# Patient Record
Sex: Female | Born: 1995 | Race: White | Hispanic: No | Marital: Single | Smoking: Never smoker
Health system: Southern US, Community
[De-identification: ages and names within clinical notes are randomized; demographics above are authoritative.]

## PROBLEM LIST (undated history)

## (undated) HISTORY — PX: CHOLECYSTECTOMY: SHX55

---

## 2015-07-21 ENCOUNTER — Emergency Department
Admission: EM | Admit: 2015-07-21 | Discharge: 2015-07-21 | Disposition: A | Discharge status: Home / Self Care | Payer: BLUE CROSS/BLUE SHIELD | Attending: Emergency Medicine | Admitting: Emergency Medicine

## 2015-07-21 ENCOUNTER — Encounter: Payer: Self-pay | Admitting: Emergency Medicine

## 2015-07-21 DIAGNOSIS — M546 Pain in thoracic spine: Secondary | ICD-10-CM | POA: Insufficient documentation

## 2015-07-21 DIAGNOSIS — Z3202 Encounter for pregnancy test, result negative: Secondary | ICD-10-CM | POA: Insufficient documentation

## 2015-07-21 LAB — URINALYSIS COMPLETE WITH MICROSCOPIC (ARMC ONLY)
BILIRUBIN URINE: NEGATIVE
Bacteria, UA: NONE SEEN
GLUCOSE, UA: NEGATIVE mg/dL
Hgb urine dipstick: NEGATIVE
Ketones, ur: NEGATIVE mg/dL
LEUKOCYTES UA: NEGATIVE
Nitrite: NEGATIVE
PH: 6 (ref 5.0–8.0)
PROTEIN: NEGATIVE mg/dL
RBC / HPF: NONE SEEN RBC/hpf (ref 0–5)
SPECIFIC GRAVITY, URINE: 1.016 (ref 1.005–1.030)

## 2015-07-21 LAB — POCT PREGNANCY, URINE: PREG TEST UR: NEGATIVE

## 2015-07-21 MED ORDER — CYCLOBENZAPRINE HCL 5 MG PO TABS: 5.0000 mg | ORAL_TABLET | Freq: Three times a day (TID) | ORAL | Status: DC | PRN

## 2015-07-21 NOTE — Discharge Instructions (Signed)
Please seek medical attention for any high fevers, chest pain, shortness of breath, change in behavior, persistent vomiting, bloody stool or any other new or concerning symptoms. ° ° °Back Pain, Adult °Back pain is very common in adults. The cause of back pain is rarely dangerous and the pain often gets better over time. The cause of your back pain may not be known. Some common causes of back pain include: °· Strain of the muscles or ligaments supporting the spine. °· Wear and tear (degeneration) of the spinal disks. °· Arthritis. °· Direct injury to the back. °For many people, back pain may return. Since back pain is rarely dangerous, most people can learn to manage this condition on their own. °HOME CARE INSTRUCTIONS °Watch your back pain for any changes. The following actions may help to lessen any discomfort you are feeling: °· Remain active. It is stressful on your back to sit or stand in one place for long periods of time. Do not sit, drive, or stand in one place for more than 30 minutes at a time. Take short walks on even surfaces as soon as you are able. Try to increase the length of time you walk each day. °· Exercise regularly as directed by your health care provider. Exercise helps your back heal faster. It also helps avoid future injury by keeping your muscles strong and flexible. °· Do not stay in bed. Resting more than 1-2 days can delay your recovery. °· Pay attention to your body when you bend and lift. The most comfortable positions are those that put less stress on your recovering back. Always use proper lifting techniques, including: °¨ Bending your knees. °¨ Keeping the load close to your body. °¨ Avoiding twisting. °· Find a comfortable position to sleep. Use a firm mattress and lie on your side with your knees slightly bent. If you lie on your back, put a pillow under your knees. °· Avoid feeling anxious or stressed. Stress increases muscle tension and can worsen back pain. It is important to  recognize when you are anxious or stressed and learn ways to manage it, such as with exercise. °· Take medicines only as directed by your health care provider. Over-the-counter medicines to reduce pain and inflammation are often the most helpful. Your health care provider may prescribe muscle relaxant drugs. These medicines help dull your pain so you can more quickly return to your normal activities and healthy exercise. °· Apply ice to the injured area: °¨ Put ice in a plastic bag. °¨ Place a towel between your skin and the bag. °¨ Leave the ice on for 20 minutes, 2-3 times a day for the first 2-3 days. After that, ice and heat may be alternated to reduce pain and spasms. °· Maintain a healthy weight. Excess weight puts extra stress on your back and makes it difficult to maintain good posture. °SEEK MEDICAL CARE IF: °· You have pain that is not relieved with rest or medicine. °· You have increasing pain going down into the legs or buttocks. °· You have pain that does not improve in one week. °· You have night pain. °· You lose weight. °· You have a fever or chills. °SEEK IMMEDIATE MEDICAL CARE IF:  °· You develop new bowel or bladder control problems. °· You have unusual weakness or numbness in your arms or legs. °· You develop nausea or vomiting. °· You develop abdominal pain. °· You feel faint. °  °This information is not intended to replace advice given to you by your health care provider. Make   sure you discuss any questions you have with your health care provider. °  °Document Released: 09/03/2005 Document Revised: 09/24/2014 Document Reviewed: 01/05/2014 °Elsevier Interactive Patient Education ©2016 Elsevier Inc. ° °

## 2015-07-21 NOTE — ED Notes (Addendum)
Pt woke up with severe lower back pain that comes in waves.  Denies hematuria.  Thinks she has kidney stone but no hx kidney stones.  One episode vomiting this morning. Nausea.

## 2015-07-21 NOTE — ED Provider Notes (Signed)
[facility] Emergency Department Provider Note   ____________________________________________  Time seen: 0805  I have reviewed the triage vital signs and the nursing notes.   HISTORY  Chief Complaint Flank Pain   History limited by: Not Limited   HPI Lindsey Torres is a 19 y.o. female who presents to the emergency department today because of concerns for mid back pain. She describes the pain as being located in her mid back bilaterally. It is slightly worse on the left. She describes it as severe and sharp. It comes and goes in waves. It does not radiate to the front or groin. She woke up with this pain. She denies any unusual activity recently. She does work in [employer] and states she was moving around sets but nothing unusual. Denies any hematuria. Denies any fevers.  History reviewed. No pertinent past medical history.  There are no active problems to display for this patient.   History reviewed. No pertinent past surgical history.  No current outpatient prescriptions on file.  Allergies Clindamycin/lincomycin  History reviewed. No pertinent family history.  Social History Social History  Substance Use Topics  . Smoking status: Never Smoker   . Smokeless tobacco: None  . Alcohol Use: Yes    Review of Systems  Constitutional: Negative for fever. Cardiovascular: Negative for chest pain. Respiratory: Negative for shortness of breath. Gastrointestinal: Negative for abdominal pain, vomiting and diarrhea. Genitourinary: Negative for dysuria. Musculoskeletal: Positive for midback pain. Skin: Negative for rash. Neurological: Negative for headaches, focal weakness or numbness.  10-point ROS otherwise negative.  ____________________________________________   PHYSICAL EXAM:  VITAL SIGNS: ED Triage Vitals  Enc Vitals Group     BP 07/21/15 0755 122/74 mmHg     Pulse Rate 07/21/15 0755 74     Resp 07/21/15 0755 18     Temp 07/21/15 0755  98.6 F (37 C)     Temp Source 07/21/15 0755 Oral     SpO2 07/21/15 0755 99 %     Weight 07/21/15 0755 210 lb (95.255 kg)     Height 07/21/15 0755  (1.702 m)     Head Cir --      Peak Flow --      Pain Score 07/21/15 0756 5   Constitutional: Alert and oriented. Well appearing and in no distress. Eyes: Conjunctivae are normal. PERRL. Normal extraocular movements. ENT   Head: Normocephalic and atraumatic.   Nose: No congestion/rhinnorhea.   Mouth/Throat: Mucous membranes are moist.   Neck: No stridor. Hematological/Lymphatic/Immunilogical: No cervical lymphadenopathy. Cardiovascular: Normal rate, regular rhythm.  No murmurs, rubs, or gallops. Respiratory: Normal respiratory effort without tachypnea nor retractions. Breath sounds are clear and equal bilaterally. No wheezes/rales/rhonchi. Gastrointestinal: Soft and nontender. No distention.  Genitourinary: Deferred Musculoskeletal: Normal range of motion in all extremities. No joint effusions.  No lower extremity tenderness nor edema. Tenderness to palpation of the paraspinal thoracic muscles.  Neurologic:  Normal speech and language. No gross focal neurologic deficits are appreciated.  Skin:  Skin is warm, dry and intact. No rash noted. Psychiatric: Mood and affect are normal. Speech and behavior are normal. Patient exhibits appropriate insight and judgment.  ____________________________________________    LABS (pertinent positives/negatives)  Labs Reviewed  URINALYSIS COMPLETEWITH MICROSCOPIC ([facility] ONLY) - Abnormal; Notable for the following:    Color, Urine YELLOW (*)    APPearance CLOUDY (*)    Squamous Epithelial / LPF 0-5 (*)    All other components within normal limits  POCT PREGNANCY, URINE  ____________________________________________   EKG  None  ____________________________________________     RADIOLOGY  None   ____________________________________________   PROCEDURES  Procedure(s) performed: None  Critical Care performed: No  ____________________________________________   INITIAL IMPRESSION / ASSESSMENT AND PLAN / ED COURSE  Pertinent labs & imaging results that were available during my care of the patient were reviewed by me and considered in my medical decision making (see chart for details).  Patient presented to the emergency department today because of concerns for back pain. It's in the mid back and bilateral. Urinalysis did not show any red blood cells. I think kidney stone unlikely given urinalysis and bilateral nature of the pain. No rash seen on exam. Some tenderness to palpation of the paraspinal muscles in the thoracic spine. I think muscle strain most likely. Will discharge home with muscle relaxant.  ____________________________________________   FINAL CLINICAL IMPRESSION(S) / ED DIAGNOSES  Final diagnoses:  Bilateral thoracic back pain     Phineas SemenGraydon Kyshon Tolliver, MD 07/21/15 1229

## 2015-08-28 ENCOUNTER — Encounter: Payer: Self-pay | Admitting: Emergency Medicine

## 2015-08-28 ENCOUNTER — Emergency Department: Payer: BLUE CROSS/BLUE SHIELD

## 2015-08-28 ENCOUNTER — Emergency Department
Admission: EM | Admit: 2015-08-28 | Discharge: 2015-08-28 | Disposition: A | Discharge status: Home / Self Care | Payer: BLUE CROSS/BLUE SHIELD | Attending: Emergency Medicine | Admitting: Emergency Medicine

## 2015-08-28 DIAGNOSIS — Y92009 Unspecified place in unspecified non-institutional (private) residence as the place of occurrence of the external cause: Secondary | ICD-10-CM | POA: Diagnosis not present

## 2015-08-28 DIAGNOSIS — S90851A Superficial foreign body, right foot, initial encounter: Secondary | ICD-10-CM

## 2015-08-28 DIAGNOSIS — S91341A Puncture wound with foreign body, right foot, initial encounter: Secondary | ICD-10-CM | POA: Insufficient documentation

## 2015-08-28 DIAGNOSIS — Y998 Other external cause status: Secondary | ICD-10-CM | POA: Diagnosis not present

## 2015-08-28 DIAGNOSIS — Y9389 Activity, other specified: Secondary | ICD-10-CM | POA: Diagnosis not present

## 2015-08-28 DIAGNOSIS — W25XXXA Contact with sharp glass, initial encounter: Secondary | ICD-10-CM | POA: Insufficient documentation

## 2015-08-28 NOTE — ED Provider Notes (Signed)
[facility] Emergency Department Provider Note ____________________________________________  Time seen: 2210  I have reviewed the triage vital signs and the nursing notes.  HISTORY  Chief Complaint  Foreign Body  HPI Lindsey Torres is a 19 y.o. female reports to the ED with complaints of foreign body sensation to the right plantar surface of the foot. She describes getting a few small pieces of glass stuck in her foot after she broke a water glass at home. She is tender to move the pieces which reduced but has been unable to do so. She denies any other injury at this time. She reports a current tetanus status.  History reviewed. No pertinent past medical history.  There are no active problems to display for this patient.  History reviewed. No pertinent past surgical history.  Current Outpatient Rx  Name  Route  Sig  Dispense  Refill  . cyclobenzaprine (FLEXERIL) 5 MG tablet   Oral   Take 1 tablet (5 mg total) by mouth every 8 (eight) hours as needed for muscle spasms.   20 tablet   0    Allergies Clindamycin/lincomycin  No family history on file.  Social History Social History  Substance Use Topics  . Smoking status: Never Smoker   . Smokeless tobacco: None  . Alcohol Use: Yes   Review of Systems  Constitutional: Negative for fever. Eyes: Negative for visual changes. ENT: Negative for sore throat. Cardiovascular: Negative for chest pain. Respiratory: Negative for shortness of breath. Gastrointestinal: Negative for abdominal pain, vomiting and diarrhea. Genitourinary: Negative for dysuria. Musculoskeletal: Negative for back pain. Skin: Negative for rash. Glass splinters to the foot as above. Neurological: Negative for headaches, focal weakness or numbness. ____________________________________________  PHYSICAL EXAM:  VITAL SIGNS: ED Triage Vitals  Enc Vitals Group     BP 08/28/15 2148 154/70 mmHg     Pulse Rate 08/28/15 2148 94      Resp 08/28/15 2148 20     Temp 08/28/15 2148 97.8 F (36.6 C)     Temp Source 08/28/15 2148 Oral     SpO2 08/28/15 2148 97 %     Weight 08/28/15 2148 215 lb (97.523 kg)     Height 08/28/15 2148  (1.702 m)     Head Cir --      Peak Flow --      Pain Score 08/28/15 2146 4     Pain Loc --      Pain Edu? --      Excl. in GC? --    Constitutional: Alert and oriented. Well appearing and in no distress. Head: Normocephalic and atraumatic.      Eyes: Conjunctivae are normal. PERRL. Normal extraocular movements      Ears: Canals clear. TMs intact bilaterally.   Nose: No congestion/rhinorrhea.   Mouth/Throat: Mucous membranes are moist.   Neck: Supple. No thyromegaly. Hematological/Lymphatic/Immunological: No cervical lymphadenopathy. Cardiovascular: Normal rate, regular rhythm.  Respiratory: Normal respiratory effort. No wheezes/rales/rhonchi. Gastrointestinal: Soft and nontender. No distention. Musculoskeletal: Nontender with normal range of motion in all extremities.  Neurologic:  Normal gait without ataxia. Normal speech and language. No gross focal neurologic deficits are appreciated. Skin:  Skin is warm, dry and intact. No rash noted. Patient with foreign body sensation to the plantar surface of the right foot she has 3 distinct areas with small puncture wounds where she feels glass when she scratches over them.  Psychiatric: Mood and affect are normal. Patient exhibits appropriate insight and judgment. ____________________________________________  PROCEDURES  Small glass splinters are removed using an 18-gauge needle bevel. Patient confirms resolution of foreign body sensation 3. ____________________________________________  INITIAL IMPRESSION / ASSESSMENT AND PLAN / ED COURSE  Right foot with retained glass splinter foreign bodies, status post removal. Patient is discharged with wound care shock shins and encouraged [facility]  symptoms. ____________________________________________  FINAL CLINICAL IMPRESSION(S) / ED DIAGNOSES  Final diagnoses:  Foreign body in foot, right, initial encounter      Lissa HoardJenise V Bacon Tony Granquist, PA-C 08/28/15 2320  Emily FilbertJonathan E Williams, MD 08/28/15 2329

## 2015-08-28 NOTE — ED Notes (Addendum)
Patient ambulatory to triage with steady gait, without difficulty or distress noted; pt reports ?piece of glass to sole of right heel

## 2015-08-28 NOTE — ED Notes (Signed)
Discussed discharge instructions and follow-up care with patient. No questions or concerns at this time. Pt stable at discharge.  

## 2015-08-28 NOTE — ED Notes (Signed)
Pt reports possible 2 pieces of glass that are stuck in her foot (one in heel, one in ball of foot) from a broken water glass. Pt states she tried to remove them with tweezers but was unable to do so and became concerned that it would get infected.

## 2015-08-28 NOTE — ED Notes (Signed)
PA removing glass from heel

## 2015-08-28 NOTE — Discharge Instructions (Signed)
Sliver Removal, Care After A sliver--also called a splinter--is a small and thin broken piece of an object that gets stuck (embedded) under the skin. A sliver can create a deep wound that can easily become infected. It is important to care for the wound after a sliver is removed to help prevent infection and other problems from developing. WHAT TO EXPECT AFTER THE PROCEDURE Slivers often break into smaller pieces when they are removed. If pieces of your sliver broke off and stayed in your skin, you will eventually see them working themselves out and you may feel some pain at the wound site. This is normal. HOME CARE INSTRUCTIONS  Keep all follow-up visits as directed by your health care provider. This is important.  There are many different ways to close and cover a wound, including stitches (sutures) and adhesive strips. Follow your health care provider's instructions about:  Wound care.  Bandage (dressing) changes and removal.  Wound closure removal.  Check the wound site every day for signs of infection. Watch for:  Red streaks coming from the wound.  Fever.  Redness or tenderness around the wound.  Fluid, blood, or pus coming from the wound.  A bad smell coming from the wound. SEEK MEDICAL CARE IF:  You think that a piece of the sliver is still in your skin.  Your wound was closed, as with sutures, and the edges of the wound break open.  You have signs of infection, including:  New or worsening redness around the wound.  New or worsening tenderness around the wound.  Fluid, blood, or pus coming from the wound.  A bad smell coming from the wound or dressing. SEEK IMMEDIATE MEDICAL CARE IF: You have any of the following signs of infection:  Red streaks coming from the wound.  An unexplained fever.   This information is not intended to replace advice given to you by your health care provider. Make sure you discuss any questions you have with your health care  provider.   Document Released: 08/31/2000 Document Revised: 09/24/2014 Document Reviewed: 05/06/2014 Elsevier Interactive Patient Education 2016 Elsevier Inc.  

## 2015-09-20 ENCOUNTER — Emergency Department: Payer: BLUE CROSS/BLUE SHIELD

## 2015-09-20 ENCOUNTER — Inpatient Hospital Stay: Payer: BLUE CROSS/BLUE SHIELD

## 2015-09-20 ENCOUNTER — Encounter: Payer: Self-pay | Admitting: *Deleted

## 2015-09-20 ENCOUNTER — Inpatient Hospital Stay
Admission: EM | Admit: 2015-09-20 | Discharge: 2015-09-21 | DRG: 445 | Disposition: A | Discharge status: Home / Self Care | Payer: BLUE CROSS/BLUE SHIELD | Attending: Specialist | Admitting: Specialist

## 2015-09-20 DIAGNOSIS — A047 Enterocolitis due to Clostridium difficile: Secondary | ICD-10-CM | POA: Diagnosis present

## 2015-09-20 DIAGNOSIS — R1013 Epigastric pain: Secondary | ICD-10-CM | POA: Diagnosis not present

## 2015-09-20 DIAGNOSIS — K8020 Calculus of gallbladder without cholecystitis without obstruction: Secondary | ICD-10-CM

## 2015-09-20 DIAGNOSIS — K831 Obstruction of bile duct: Secondary | ICD-10-CM

## 2015-09-20 DIAGNOSIS — K920 Hematemesis: Secondary | ICD-10-CM | POA: Diagnosis present

## 2015-09-20 DIAGNOSIS — R739 Hyperglycemia, unspecified: Secondary | ICD-10-CM | POA: Diagnosis present

## 2015-09-20 DIAGNOSIS — Z888 Allergy status to other drugs, medicaments and biological substances status: Secondary | ICD-10-CM | POA: Diagnosis not present

## 2015-09-20 DIAGNOSIS — K8021 Calculus of gallbladder without cholecystitis with obstruction: Secondary | ICD-10-CM

## 2015-09-20 DIAGNOSIS — Z79899 Other long term (current) drug therapy: Secondary | ICD-10-CM | POA: Diagnosis not present

## 2015-09-20 DIAGNOSIS — K8051 Calculus of bile duct without cholangitis or cholecystitis with obstruction: Secondary | ICD-10-CM | POA: Diagnosis not present

## 2015-09-20 DIAGNOSIS — R109 Unspecified abdominal pain: Secondary | ICD-10-CM

## 2015-09-20 LAB — URINALYSIS COMPLETE WITH MICROSCOPIC (ARMC ONLY)
GLUCOSE, UA: NEGATIVE mg/dL
HGB URINE DIPSTICK: NEGATIVE
Ketones, ur: NEGATIVE mg/dL
Leukocytes, UA: NEGATIVE
Nitrite: NEGATIVE
Protein, ur: NEGATIVE mg/dL
Specific Gravity, Urine: 1.021 (ref 1.005–1.030)
WBC, UA: NONE SEEN WBC/hpf (ref 0–5)
pH: 7 (ref 5.0–8.0)

## 2015-09-20 LAB — CREATININE, SERUM
CREATININE: 0.69 mg/dL (ref 0.44–1.00)
GFR calc non Af Amer: >60 mL/min (ref >60)

## 2015-09-20 LAB — COMPREHENSIVE METABOLIC PANEL
ALK PHOS: 112 U/L (ref 38–126)
ALT: 84 U/L — AB (ref 14–54)
AST: 126 U/L — AB (ref 15–41)
Albumin: 4.4 g/dL (ref 3.5–5.0)
Anion gap: 7 (ref 5–15)
BUN: 10 mg/dL (ref 6–20)
CALCIUM: 9.8 mg/dL (ref 8.9–10.3)
CO2: 28 mmol/L (ref 22–32)
CREATININE: 0.7 mg/dL (ref 0.44–1.00)
Chloride: 101 mmol/L (ref 101–111)
Glucose, Bld: 102 mg/dL — ABNORMAL HIGH (ref 65–99)
Potassium: 4 mmol/L (ref 3.5–5.1)
SODIUM: 136 mmol/L (ref 135–145)
Total Bilirubin: 3.4 mg/dL — ABNORMAL HIGH (ref 0.3–1.2)
Total Protein: 8.5 g/dL — ABNORMAL HIGH (ref 6.5–8.1)

## 2015-09-20 LAB — CBC
HCT: 42.7 % (ref 35.0–47.0)
HCT: 41.8 % (ref 35.0–47.0)
Hemoglobin: 14.4 g/dL (ref 12.0–16.0)
Hemoglobin: 14.3 g/dL (ref 12.0–16.0)
MCH: 28.4 pg (ref 26.0–34.0)
MCH: 28.4 pg (ref 26.0–34.0)
MCHC: 33.8 g/dL (ref 32.0–36.0)
MCHC: 34.3 g/dL (ref 32.0–36.0)
MCV: 84.1 fL (ref 80.0–100.0)
MCV: 82.7 fL (ref 80.0–100.0)
PLATELETS: 286 10*3/uL (ref 150–440)
PLATELETS: 259 10*3/uL (ref 150–440)
RBC: 5.07 MIL/uL (ref 3.80–5.20)
RBC: 5.05 MIL/uL (ref 3.80–5.20)
RDW: 13.5 % (ref 11.5–14.5)
RDW: 13.3 % (ref 11.5–14.5)
WBC: 7 10*3/uL (ref 3.6–11.0)
WBC: 7.8 10*3/uL (ref 3.6–11.0)

## 2015-09-20 LAB — BILIRUBIN, DIRECT: BILIRUBIN DIRECT: 0.8 mg/dL — AB (ref 0.1–0.5)

## 2015-09-20 LAB — LIPASE, BLOOD: Lipase: 18 U/L (ref 11–51)

## 2015-09-20 LAB — POCT PREGNANCY, URINE: PREG TEST UR: NEGATIVE

## 2015-09-20 MED ORDER — DOCUSATE SODIUM 100 MG PO CAPS
100.0000 mg | ORAL_CAPSULE | Freq: Two times a day (BID) | ORAL | Status: DC
  Administered 2015-09-20 – 2015-09-21 (×2): 100 mg via ORAL
  Filled 2015-09-20 (×2): qty 1

## 2015-09-20 MED ORDER — NORETHIN ACE-ETH ESTRAD-FE 1-20 MG-MCG PO TABS
1.0000 | ORAL_TABLET | Freq: Every day | ORAL | Status: DC
  Administered 2015-09-21: 1 via ORAL

## 2015-09-20 MED ORDER — LEVOFLOXACIN IN D5W 500 MG/100ML IV SOLN
500.0000 mg | INTRAVENOUS | Status: DC
  Administered 2015-09-20: 500 mg via INTRAVENOUS
  Filled 2015-09-20: qty 100

## 2015-09-20 MED ORDER — ONDANSETRON 4 MG PO TBDP
ORAL_TABLET | ORAL | Status: AC
  Filled 2015-09-20: qty 1

## 2015-09-20 MED ORDER — PANTOPRAZOLE SODIUM 40 MG IV SOLR
40.0000 mg | Freq: Two times a day (BID) | INTRAVENOUS | Status: DC
  Administered 2015-09-20 – 2015-09-21 (×2): 40 mg via INTRAVENOUS
  Filled 2015-09-20 (×2): qty 40

## 2015-09-20 MED ORDER — HYDROCODONE-ACETAMINOPHEN 5-325 MG PO TABS: 1.0000 | ORAL_TABLET | ORAL | Status: DC | PRN

## 2015-09-20 MED ORDER — ACETAMINOPHEN 325 MG PO TABS: 650.0000 mg | ORAL_TABLET | Freq: Four times a day (QID) | ORAL | Status: DC | PRN

## 2015-09-20 MED ORDER — MORPHINE SULFATE (PF) 4 MG/ML IV SOLN: 4.0000 mg | Freq: Once | INTRAVENOUS | Status: DC

## 2015-09-20 MED ORDER — PANTOPRAZOLE SODIUM 40 MG PO TBEC
40.0000 mg | DELAYED_RELEASE_TABLET | Freq: Once | ORAL | Status: AC
  Administered 2015-09-20: 40 mg via ORAL

## 2015-09-20 MED ORDER — ONDANSETRON 4 MG PO TBDP
4.0000 mg | ORAL_TABLET | Freq: Once | ORAL | Status: AC
  Administered 2015-09-20: 4 mg via ORAL

## 2015-09-20 MED ORDER — ONDANSETRON HCL 4 MG/2ML IJ SOLN: 4.0000 mg | Freq: Once | INTRAMUSCULAR | Status: DC

## 2015-09-20 MED ORDER — GADOBENATE DIMEGLUMINE 529 MG/ML IV SOLN
20.0000 mL | Freq: Once | INTRAVENOUS | Status: AC | PRN
  Administered 2015-09-20: 19 mL via INTRAVENOUS

## 2015-09-20 MED ORDER — SODIUM CHLORIDE 0.9 % IV SOLN
INTRAVENOUS | Status: DC
  Administered 2015-09-20 – 2015-09-21 (×2): via INTRAVENOUS

## 2015-09-20 MED ORDER — PANTOPRAZOLE SODIUM 40 MG IV SOLR: 40.0000 mg | Freq: Once | INTRAVENOUS | Status: DC

## 2015-09-20 MED ORDER — MORPHINE SULFATE (PF) 4 MG/ML IV SOLN: 4.0000 mg | INTRAVENOUS | Status: DC | PRN

## 2015-09-20 MED ORDER — SODIUM CHLORIDE 0.9 % IV BOLUS (SEPSIS)
1000.0000 mL | Freq: Once | INTRAVENOUS | Status: DC
Start: 2015-09-20 — End: 2015-09-20

## 2015-09-20 MED ORDER — ONDANSETRON HCL 4 MG PO TABS: 4.0000 mg | ORAL_TABLET | Freq: Four times a day (QID) | ORAL | Status: DC | PRN

## 2015-09-20 MED ORDER — ONDANSETRON HCL 4 MG/2ML IJ SOLN: 4.0000 mg | Freq: Four times a day (QID) | INTRAMUSCULAR | Status: DC | PRN

## 2015-09-20 MED ORDER — PANTOPRAZOLE SODIUM 40 MG PO TBEC
DELAYED_RELEASE_TABLET | ORAL | Status: AC
  Filled 2015-09-20: qty 1

## 2015-09-20 MED ORDER — ENOXAPARIN SODIUM 40 MG/0.4ML ~~LOC~~ SOLN: 40.0000 mg | SUBCUTANEOUS | Status: DC

## 2015-09-20 MED ORDER — LORAZEPAM 2 MG/ML IJ SOLN: 1.0000 mg | INTRAMUSCULAR | Status: DC | PRN

## 2015-09-20 MED ORDER — ACETAMINOPHEN 650 MG RE SUPP: 650.0000 mg | Freq: Four times a day (QID) | RECTAL | Status: DC | PRN

## 2015-09-20 NOTE — ED Notes (Signed)
Report called to floor nurse.  Iv meds infusing.  Friends with pt.

## 2015-09-20 NOTE — Progress Notes (Signed)
Dr. Clint GuyHower notified of pt's MR[place] [facility]. Asked MD for pt to have a diet.  MD will place order.

## 2015-09-20 NOTE — H&P (Signed)
[facility] [place] [facility]   PATIENT NAME: Lindsey FinesKyra Torres    MR#:  [id number]  DATE OF BIRTH:  06/19/1996  DATE OF ADMISSION:  09/20/2015  PRIMARY CARE PHYSICIAN: Pcp Not In System   REQUESTING/REFERRING PHYSICIAN:   CHIEF COMPLAINT:   Chief Complaint  Patient presents with  . Abdominal Pain    HISTORY OF PRESENT ILLNESS: Lindsey Torres  is a 20 y.o. female with a known history of patient is a 20 year old Caucasian female with past medical history significant for history of C. difficile colitis after using clindamycin who presents to the hospital with complaints of intermittent epigastric abdominal pains since November 2016, and the nausea and vomiting. According to the patient, she had 3 separate pain attacks in upper abdomen with nausea, vomiting. Pain was described as 9 out of 10 by intensity, intermittent pain and is now alleviating or aggravating factors, radiating to the back, occasionally she would vomit and at some point pain would subside.  Yesterday she had an episode of pain again and with vomiting is she noted streaks of blood in her vomitus. On arrival to emergency room, she was noted to have elevated total bilirubin and went ultrasound of her right upper quadrant which revealed gallstones but normal common bile duct size. Patient is afebrile  PAST MEDICAL HISTORY:  History reviewed. No pertinent past medical history.  PAST SURGICAL HISTORY: History reviewed. No pertinent past surgical history.  SOCIAL HISTORY:  Social History  Substance Use Topics  . Smoking status: Never Smoker   . Smokeless tobacco: Not on file  . Alcohol Use: Yes    FAMILY HISTORY: No family history on file.  DRUG ALLERGIES:  Allergies  Allergen Reactions  . Clindamycin/Lincomycin Other (See Comments)    cdif    Review of Systems  Constitutional: Negative for fever, chills, weight loss and malaise/fatigue.  HENT: Negative for congestion.   Eyes: Negative for  blurred vision and double vision.  Respiratory: Negative for cough, sputum production, shortness of breath and wheezing.   Cardiovascular: Negative for chest pain, palpitations, orthopnea, leg swelling and PND.  Gastrointestinal: Positive for abdominal pain. Negative for nausea, vomiting, diarrhea, constipation, blood in stool and melena.  Genitourinary: Negative for dysuria, urgency, frequency and hematuria.  Musculoskeletal: Negative for falls.  Skin: Negative for rash.  Neurological: Negative for dizziness and weakness.  Psychiatric/Behavioral: Negative for depression and memory loss. The patient is not nervous/anxious.     MEDICATIONS AT HOME:  Prior to Admission medications   Medication Sig Start Date End Date Taking? Authorizing Provider  norethindrone-ethinyl estradiol (JUNEL FE,GILDESS FE,LOESTRIN FE) 1-20 MG-MCG tablet Take 1 tablet by mouth daily.   Yes Historical Provider, MD      PHYSICAL EXAMINATION:   VITAL SIGNS: Blood pressure 111/75, pulse 76, temperature 98.1 F (36.7 C), temperature source Oral, resp. rate 20, height 5\' 7"  (1.702 m), weight 92.987 kg (205 lb), last menstrual period 09/15/2015, SpO2 96 %.  GENERAL:  20 y.o.-year-old patient lying in the bed with no acute distress.  EYES: Pupils equal, round, reactive to light and accommodation. No scleral icterus. Extraocular muscles intact.  HEENT: Head atraumatic, normocephalic. Oropharynx and nasopharynx clear.  NECK:  Supple, no jugular venous distention. No thyroid enlargement, no tenderness.  LUNGS: Normal breath sounds bilaterally, no wheezing, rales,rhonchi or crepitation. No use of accessory muscles of respiration.  CARDIOVASCULAR: S1, S2 normal. No murmurs, rubs, or gallops.  ABDOMEN: Soft, significant epigastric and right upper quadrant pain was noted on  palpation but no rebound or guarding, nondistended. Bowel sounds present. No organomegaly or mass.  EXTREMITIES: No pedal edema, cyanosis, or clubbing.   NEUROLOGIC: Cranial nerves II through XII are intact. Muscle strength 5/5 in all extremities. Sensation intact. Gait not checked.  PSYCHIATRIC: The patient is alert and oriented x 3.  SKIN: No obvious rash, lesion, or ulcer.   LABORATORY PANEL:   CBC  Recent Labs Lab 09/20/15 1215  WBC 7.0  HGB 14.4  HCT 42.7  PLT 286  MCV 84.1  MCH 28.4  MCHC 33.8  RDW 13.5   ------------------------------------------------------------------------------------------------------------------  Chemistries   Recent Labs Lab 09/20/15 1215  NA 136  K 4.0  CL 101  CO2 28  GLUCOSE 102*  BUN 10  CREATININE 0.70  CALCIUM 9.8  AST 126*  ALT 84*  ALKPHOS 112  BILITOT 3.4*   ------------------------------------------------------------------------------------------------------------------  Cardiac Enzymes No results for input(s): TROPONINI in the last 168 hours. ------------------------------------------------------------------------------------------------------------------  RADIOLOGY: Dg Chest 1 View  09/20/2015  CLINICAL DATA:  Epigastric pain today an beginning yesterday. Worsening shortness of breath, vomiting. EXAM: CHEST 1 VIEW COMPARISON:  None. FINDINGS: The heart size and mediastinal contours are within normal limits. Both lungs are clear. The visualized skeletal structures are unremarkable. IMPRESSION: No active disease. Electronically Signed   By: Charlett Nose M.D.   On: 09/20/2015 15:36   US Abdomen Limited Ruq  09/20/2015  CLINICAL DATA:  Epigastric pain for 1 day EXAM: US ABDOMEN LIMITED - RIGHT UPPER QUADRANT COMPARISON:  None. FINDINGS: Gallbladder: Multiple mobile gallstones are noted within gallbladder the largest measures 7 mm. No thickening of gallbladder wall. No sonographic Murphy's sign. Common bile duct: Diameter: 3 mm in diameter within normal limits. Liver: No focal lesion identified. Within normal limits in parenchymal echogenicity. IMPRESSION: 1. Multiple mobile  gallstones are noted within gallbladder the largest measures 7 mm. No thickening of gallbladder wall. No sonographic Murphy's sign. Normal CBD. Electronically Signed   By: Natasha Mead M.D.   On: 09/20/2015 16:07    EKG: No orders found for this or any previous visit.  IMPRESSION AND PLAN:  Principal Problem:   Obstructive jaundice Active Problems:   Epigastric abdominal pain   Gallstones   Hyperglycemia 1. Obstructive jaundice, concerning for common bile duct stone, admitted patient medical floor, get MRCP and ERCP if MRCP is positive for common bile duct stone 2. Cholelithiasis, patient will need to have gallbladder surgery done at some point. However, patient's family wants her to have that procedure. In [place], where she is from. Discussed with patient's family 3. Epigastric abdominal pain due to gallbladder disease/gallstones, pain medications as needed 4. Hyperglycemia, get hemoglobin A1c to rule out diabetes 5. Hematemesis. Initiate patient on Protonix intravenously, likely trauma related follow hemoglobin level closely  All the records are reviewed and case discussed with ED provider. Management plans discussed with the patient, family and they are in agreement.  CODE STATUS:    TOTAL TIME TAKING CARE OF THIS PATIENT: 65 minutes.   His chest is radiology attempted to reach MR Department, discussed with patient's family, time spent approximately 15 minutes on additional discussions  Ilona Colley M.D on 09/20/2015 at 5:24 PM  Between 7am to 6pm - Pager - [phone] After 6pm go to [email or web address] - password [email or web address]  [facility]  Office  [phone]  CC: Primary care physician; Pcp Not In System

## 2015-09-20 NOTE — ED Notes (Signed)
Patient transported to Ultrasound 

## 2015-09-20 NOTE — ED Provider Notes (Signed)
[facility] Emergency Department Provider Note  ____________________________________________  Time seen: Approximately 250 PM  I have reviewed the triage vital signs and the nursing notes.   HISTORY  Chief Complaint Abdominal Pain    HPI Eual Lindsey Torres is a 20 y.o. female without any chronic medical issues who is presenting to the emergency Department today with epigastric abdominal pain with vomiting. She says that she has had 3 episodes of this starting over the past several weeks. She says that her abdominal pain first started during a trip the [place] where she was evaluated with a CAT scan of the abdomen at in [facility] where she was found have "sludge" in her liver.She says that the abdominal pain, nausea and vomiting has returned over the past day with pain to the top of her stomach which she describes a cramping. She says that she has vomited 3 times and says that she has noticed blood streaks in her vomit. She says that she does not have any diarrhea. Her pain is a 6 out of 10 right now it is radiating through to her right side of her back. Says that she'll drink occasionally not heavily and does not use any drugs regularly. Does not want an IV or pain meds at this time because she does not have a ride home. Patient says that she has appointment tomorrow with her gastroenterologist in [place].   History reviewed. No pertinent past medical history.  There are no active problems to display for this patient.   History reviewed. No pertinent past surgical history.  Current Outpatient Rx  Name  Route  Sig  Dispense  Refill  . cyclobenzaprine (FLEXERIL) 5 MG tablet   Oral   Take 1 tablet (5 mg total) by mouth every 8 (eight) hours as needed for muscle spasms.   20 tablet   0     Allergies Clindamycin/lincomycin  No family history on file.  Social History Social History  Substance Use Topics  . Smoking status: Never Smoker   . Smokeless  tobacco: None  . Alcohol Use: Yes    Review of Systems Constitutional: No fever/chills Eyes: No visual changes. ENT: No sore throat. Cardiovascular: Denies chest pain. Respiratory: Denies shortness of breath. Gastrointestinal: No diarrhea.  No constipation. Genitourinary: Negative for dysuria. Musculoskeletal: Negative for back pain. Skin: Negative for rash. Neurological: Negative for headaches, focal weakness or numbness.  10-point ROS otherwise negative.  ____________________________________________   PHYSICAL EXAM:  VITAL SIGNS: ED Triage Vitals  Enc Vitals Group     BP 09/20/15 1205 111/75 mmHg     Pulse Rate 09/20/15 1205 76     Resp 09/20/15 1205 20     Temp 09/20/15 1205 98.1 F (36.7 C)     Temp Source 09/20/15 1205 Oral     SpO2 09/20/15 1205 96 %     Weight 09/20/15 1205 205 lb (92.987 kg)     Height 09/20/15 1205 5\' 7"  (1.702 m)     Head Cir --      Peak Flow --      Pain Score 09/20/15 1212 6     Pain Loc --      Pain Edu? --      Excl. in GC? --     Constitutional: Alert and oriented. Well appearing and in no acute distress. Eyes: Conjunctivae are normal. PERRL. EOMI. Head: Atraumatic. Nose: No congestion/rhinnorhea. Mouth/Throat: Mucous membranes are moist.   Neck: No stridor.   Cardiovascular: Normal rate, regular  rhythm. Grossly normal heart sounds.  Good peripheral circulation. Respiratory: Normal respiratory effort.  No retractions. Lungs CTAB. Gastrointestinal: Soft with tenderness palpation of the epigastrium and right upper quadrant with a positive Murphy sign. No distention. No abdominal bruits. No CVA tenderness. Musculoskeletal: No lower extremity tenderness nor edema.  No joint effusions. Neurologic:  Normal speech and language. No gross focal neurologic deficits are appreciated. No gait instability. Skin:  Skin is warm, dry and intact. No rash noted. Psychiatric: Mood and affect are normal. Speech and behavior are  normal.  ____________________________________________   LABS (all labs ordered are listed, but only abnormal results are displayed)  Labs Reviewed  COMPREHENSIVE METABOLIC PANEL - Abnormal; Notable for the following:    Glucose, Bld 102 (*)    Total Protein 8.5 (*)    AST 126 (*)    ALT 84 (*)    Total Bilirubin 3.4 (*)    All other components within normal limits  URINALYSIS COMPLETEWITH MICROSCOPIC ([facility] ONLY) - Abnormal; Notable for the following:    Color, Urine AMBER (*)    APPearance CLOUDY (*)    Bilirubin Urine 1+ (*)    Bacteria, UA RARE (*)    Squamous Epithelial / LPF 0-5 (*)    All other components within normal limits  LIPASE, BLOOD  CBC  POC URINE PREG, ED  POCT PREGNANCY, URINE   ____________________________________________  EKG  ____________________________________________  RADIOLOGY  Chest x-ray without any active disease. Ultrasound of the gallbladder with multiple mobile gallstones with the largest measuring 7 mm. No thickening of the gallbladder wall. No sonographic Murphy sign. Normal CBD. ____________________________________________   PROCEDURES   ____________________________________________   INITIAL IMPRESSION / ASSESSMENT AND PLAN / ED COURSE  Pertinent labs & imaging results that were available during my care of the patient were reviewed by me and considered in my medical decision making (see chart for details).  ----------------------------------------- 5:02 PM on 09/20/2015 -----------------------------------------  Patient resting comfortably but still with tenderness to the upper abdomen on exam. Discussed case Dr.Woodham of the surgical service who is aware and says to admit to medicine for likely choledocholithiasis. Dr. Tonita Cong so the patient will likely need an ERCP. I explained the diagnosis as well as the need for admission to the patient as well as her parents. She requested that I speak to her parents.  Parents said that  they were considering bring her back to [place] where she is from where she may be worked up by a known gastroenterologist. They asked what would be the potential drawbacks of this and explained that the biggest issue would likely be a delay in care. I explained that the patient is showing some early signs of liver damage with elevated transaminases. Her bilirubin is also elevated at 3.4.  Signed out to Dr. Seth Bake of the medicine service who says that she will likely perform MRCP initially. Patient understanding of the plan and willing to comply. ____________________________________________   FINAL CLINICAL IMPRESSION(S) / ED DIAGNOSES  Final diagnoses:  Epigastric abdominal pain   Cholelithiasis. Choledocholithiasis.   Myrna Blazer, MD 09/20/15 [phone]

## 2015-09-20 NOTE — ED Notes (Signed)
Pt reports abdominal pain since Thanksgiving, pt was seen at an ER while on vacation in [place], per pt gallbladder was ruled out, pt reports nausea and vomiting

## 2015-09-21 LAB — COMPREHENSIVE METABOLIC PANEL
ALBUMIN: 3.5 g/dL (ref 3.5–5.0)
ALK PHOS: 88 U/L (ref 38–126)
ALT: 82 U/L — ABNORMAL HIGH (ref 14–54)
ANION GAP: 6 (ref 5–15)
AST: 79 U/L — AB (ref 15–41)
BILIRUBIN TOTAL: 1.2 mg/dL (ref 0.3–1.2)
BUN: 9 mg/dL (ref 6–20)
CO2: 25 mmol/L (ref 22–32)
Calcium: 9.1 mg/dL (ref 8.9–10.3)
Chloride: 105 mmol/L (ref 101–111)
Creatinine, Ser: 0.71 mg/dL (ref 0.44–1.00)
GFR calc Af Amer: >60 mL/min (ref >60)
GFR calc non Af Amer: >60 mL/min (ref >60)
GLUCOSE: 106 mg/dL — AB (ref 65–99)
POTASSIUM: 3.7 mmol/L (ref 3.5–5.1)
SODIUM: 136 mmol/L (ref 135–145)
TOTAL PROTEIN: 6.9 g/dL (ref 6.5–8.1)

## 2015-09-21 LAB — CBC
HEMATOCRIT: 37.5 % (ref 35.0–47.0)
HEMOGLOBIN: 12.8 g/dL (ref 12.0–16.0)
MCH: 27.6 pg (ref 26.0–34.0)
MCHC: 34.1 g/dL (ref 32.0–36.0)
MCV: 80.9 fL (ref 80.0–100.0)
Platelets: 234 10*3/uL (ref 150–440)
RBC: 4.63 MIL/uL (ref 3.80–5.20)
RDW: 13.1 % (ref 11.5–14.5)
WBC: 7.9 10*3/uL (ref 3.6–11.0)

## 2015-09-21 LAB — HEMOGLOBIN A1C: Hgb A1c MFr Bld: 5.3 % (ref 4.0–6.0)

## 2015-09-21 LAB — TYPE AND SCREEN
ABO/RH(D): O POS
Antibody Screen: NEGATIVE

## 2015-09-21 LAB — HEMOGLOBIN: Hemoglobin: 12.6 g/dL (ref 12.0–16.0)

## 2015-09-21 LAB — ABO/RH: ABO/RH(D): O POS

## 2015-09-21 NOTE — Discharge Summary (Signed)
[facility] [place] [facility]   PATIENT NAME: Lindsey Torres    MR#:  [id number]  DATE OF BIRTH:  10/28/1995  DATE OF ADMISSION:  09/20/2015 ADMITTING PHYSICIAN: Katharina Caperima Vaickute, MD  DATE OF DISCHARGE: 09/21/2015  2:25 PM  PRIMARY CARE PHYSICIAN: Pcp Not In System    ADMISSION DIAGNOSIS:  Epigastric abdominal pain [R10.13] Abdominal pain [R10.9] Calculus of gallbladder with biliary obstruction but without cholecystitis [K80.21]  DISCHARGE DIAGNOSIS:  Principal Problem:   Obstructive jaundice Active Problems:   Epigastric abdominal pain   Gallstones   Hyperglycemia   SECONDARY DIAGNOSIS:  History reviewed. No pertinent past medical history.  HOSPITAL COURSE:   20 year old female with no significant past medical history who presented to the hospital abdominal pain nausea, vomiting and noted to have abnormal LFTs and CT scan findings suggestive of gallstones and choledocholithiasis  #1 abdominal pain-this was secondary to biliary colic. Patient was noted to have a CT scan suggestive of gallstones and a possible common bile duct stone. She also had abnormal LFTs with elevated total bilirubin. She was admitted to the hospital started on supportive care with IV fluids, antiemetics, pain control. An MRCP was obtained.  The results of the MRCP were negative without any evidence of common bile duct stones. -With supportive care patient's LFTs improved and her bilirubins came down. She still had some mild right upper quadrant pain but no fever, nausea, vomiting. She was able to tolerate a diet well. -She likely needs a laparoscopic cholecystectomy in the near future and this was offered to the patient and her mother but they will prefer to have the [facility] and have the surgery done there. She was discharged home as she was clinically feeling well and the mother has an appointment with a general [facility] the next day at 10:30 AM. -The mother was  also advised that the patient should follow a low-fat diet and she is at risk for having another biliary colic attack at any time and she was aware of that.    She is being discharged home on a low fat diet with [facility].   DISCHARGE CONDITIONS:   stable  CONSULTS OBTAINED:     DRUG ALLERGIES:   Allergies  Allergen Reactions  . Clindamycin/Lincomycin Other (See Comments)    cdif    DISCHARGE MEDICATIONS:   Discharge Medication List as of 09/21/2015  2:02 PM    CONTINUE these medications which have NOT CHANGED   Details  norethindrone-ethinyl estradiol (JUNEL FE,GILDESS FE,LOESTRIN FE) 1-20 MG-MCG tablet Take 1 tablet by mouth daily., Until Discontinued, Historical Med         DISCHARGE INSTRUCTIONS:   DIET:  Low fat, Low cholesterol diet  DISCHARGE CONDITION:  Stable  ACTIVITY:  Activity as tolerated  OXYGEN:  Home Oxygen: No.   Oxygen Delivery: room air  DISCHARGE LOCATION:  home   If you experience worsening of your admission symptoms, develop shortness of breath, life threatening emergency, suicidal or homicidal thoughts you must seek medical attention immediately by calling 911 or calling your MD immediately  if symptoms less severe.  You Must read complete instructions/literature along with all the possible adverse reactions/side effects for all the Medicines you take and that have been prescribed to you. Take any new Medicines after you have completely understood and accpet all the possible adverse reactions/side effects.   Please note  You were cared for by a hospitalist during your hospital stay.  If you have any questions about your discharge medications or the care you received while you were in the hospital after you are discharged, you can call the unit and asked to speak with the hospitalist on call if the hospitalist that took care of you is not available. Once you are discharged, your primary care physician will handle any further  medical issues. Please note that NO REFILLS for any discharge medications will be authorized once you are discharged, as it is imperative that you return to your primary care physician (or establish a relationship with a primary care physician if you do not have one) for your aftercare needs so that they can reassess your need for medications and monitor your lab values.     Today   Mild abdominal pain, no N/V, fever, chills. Tolerating PO well.   VITAL SIGNS:  Blood pressure 104/51, pulse 71, temperature 98.1 F (36.7 C), temperature source Oral, resp. rate 16, height 5\' 7"  (1.702 m), weight 92.987 kg (205 lb), last menstrual period 09/15/2015, SpO2 99 %.  I/O:   Intake/Output Summary (Last 24 hours) at 09/21/15 1723 Last data filed at 09/21/15 0900  Gross per 24 hour  Intake    715 ml  Output    250 ml  Net    465 ml    PHYSICAL EXAMINATION:  GENERAL:  20 y.o.-year-old patient lying in the bed with no acute distress.  EYES: Pupils equal, round, reactive to light and accommodation. No scleral icterus. Extraocular muscles intact.  HEENT: Head atraumatic, normocephalic. Oropharynx and nasopharynx clear.  NECK:  Supple, no jugular venous distention. No thyroid enlargement, no tenderness.  LUNGS: Normal breath sounds bilaterally, no wheezing, rales,rhonchi. No use of accessory muscles of respiration.  CARDIOVASCULAR: S1, S2 normal. No murmurs, rubs, or gallops.  ABDOMEN: Soft, mild ruq abdominal pain but no rebound, rigidity, non-distended. Bowel sounds present. No organomegaly or mass.  EXTREMITIES: No pedal edema, cyanosis, or clubbing.  NEUROLOGIC: Cranial nerves II through XII are intact. No focal motor or sensory defecits b/l.  PSYCHIATRIC: The patient is alert and oriented x 3. Good affect.  SKIN: No obvious rash, lesion, or ulcer.   DATA REVIEW:   CBC  Recent Labs Lab 09/21/15 0218 09/21/15 0929  WBC 7.9  --   HGB 12.8 12.6  HCT 37.5  --   PLT 234  --      Chemistries   Recent Labs Lab 09/21/15 0218  NA 136  K 3.7  CL 105  CO2 25  GLUCOSE 106*  BUN 9  CREATININE 0.71  CALCIUM 9.1  AST 79*  ALT 82*  ALKPHOS 88  BILITOT 1.2    Cardiac Enzymes No results for input(s): TROPONINI in the last 168 hours.  Microbiology Results  No results found for this or any previous visit.  RADIOLOGY:  Dg Chest 1 View  09/20/2015  CLINICAL DATA:  Epigastric pain today an beginning yesterday. Worsening shortness of breath, vomiting. EXAM: CHEST 1 VIEW COMPARISON:  None. FINDINGS: The heart size and mediastinal contours are within normal limits. Both lungs are clear. The visualized skeletal structures are unremarkable. IMPRESSION: No active disease. Electronically Signed   By: Charlett Nose M.D.   On: 09/20/2015 15:36   Mr Abd W/wo Cm/mrcp  09/20/2015  CLINICAL DATA:  Elevated bilirubin. Multiple gallstones. Concern for common bile duct stone. EXAM: MRI ABDOMEN WITHOUT AND WITH CONTRAST (INCLUDING MRCP) TECHNIQUE: Multiplanar multisequence MR imaging of the abdomen was performed both before and after the administration  of intravenous contrast. Heavily T2-weighted images of the biliary and pancreatic ducts were obtained, and three-dimensional MRCP images were rendered by post processing. CONTRAST:  19mL MULTIHANCE GADOBENATE DIMEGLUMINE 529 MG/ML IV SOLN COMPARISON:  Ultrasound 09/20/2015 FINDINGS: Lower chest:  Lung bases are clear. Hepatobiliary: Liver has normal signal intensity. No hepatic steatosis. No intrahepatic biliary duct dilatation. The common hepatic duct and common bile duct are normal caliber. The common hepatic duct measures 4 mm while the common bile duct measures 3 mm. There is no filling defect within the common bile duct. No external compression. Multiple small gallstones layer dependently within the gallbladder lumen measuring approximately 3 mm each. There is no gallbladder wall thickening or inflammation evident. The post-contrast  imaging demonstrates no enhancing hepatic lesion. Pancreas: Normal pancreatic parenchymal intensity. No ductal dilatation or inflammation. Spleen: Normal spleen. Adrenals/urinary tract: Adrenal glands and kidneys are normal. Stomach/Bowel: Submental and limited view of the bowel is unremarkable. Vascular/Lymphatic: No abdominal lymphadenopathy. Musculoskeletal: No aggressive osseous lesion. IMPRESSION: 1. No evidence of biliary obstruction.  No choledocholithiasis. 2. Multiple gallstones within the lumen of the gallbladder without evidence cholecystitis. 3. No focal hepatic abnormality. These results will be called to the ordering clinician or representative by the Radiologist Assistant, and communication documented in the PACS or zVision Dashboard. Electronically Signed   By: Genevive Bi M.D.   On: 09/20/2015 21:01   US Abdomen Limited Ruq  09/20/2015  CLINICAL DATA:  Epigastric pain for 1 day EXAM: US ABDOMEN LIMITED - RIGHT UPPER QUADRANT COMPARISON:  None. FINDINGS: Gallbladder: Multiple mobile gallstones are noted within gallbladder the largest measures 7 mm. No thickening of gallbladder wall. No sonographic Murphy's sign. Common bile duct: Diameter: 3 mm in diameter within normal limits. Liver: No focal lesion identified. Within normal limits in parenchymal echogenicity. IMPRESSION: 1. Multiple mobile gallstones are noted within gallbladder the largest measures 7 mm. No thickening of gallbladder wall. No sonographic Murphy's sign. Normal CBD. Electronically Signed   By: Natasha Mead M.D.   On: 09/20/2015 16:07      Management plans discussed with the patient, family and they are in agreement.  CODE STATUS:     Code Status Orders        Start     Ordered   09/20/15 1840  Full code   Continuous     09/20/15 1839      TOTAL TIME TAKING CARE OF THIS PATIENT: 40 minutes.    Houston Siren M.D on 09/21/2015 at 5:23 PM  Between 7am to 6pm - Pager - [phone]  After 6pm go to  [email or web address] - password [email or web address]  [facility]  Office  [phone]  CC: Primary care physician; Pcp Not In System

## 2015-09-21 NOTE — Progress Notes (Signed)
Attempted to call on call MD in reference to pt's Hg level drop from 14.3 to 12.8. MD currently in code blue at this time. This nurse will call later.

## 2015-09-21 NOTE — Progress Notes (Signed)
Dr. Sheryle Hailiamond notified of pt's drop in hg level from 14.3 to 12.8. Orders to place type and screen.

## 2015-09-21 NOTE — Progress Notes (Signed)
Pt discharged per MD orders. VSS. IV removed per policy. Discharge instructions removed w/ pt and mother. Both verbalized understanding. No prescriptions. Discharged ambulatory, escorted by mother.

## 2015-09-21 NOTE — Progress Notes (Signed)
[facility]    Lindsey KennedyKyra Rush BarerGerber was admitted to the Hospital on 09/20/2015 and Discharged  09/21/2015 and should be excused from work/school   for 4 days starting 09/20/2015 , may return to work/school without any restrictions.  Call Hilda LiasVivek Sainani MD, [facility]  [phone] with questions.  Houston SirenSAINANI,VIVEK J M.D on 09/21/2015,at 1:49 PM

## 2016-01-18 ENCOUNTER — Emergency Department: Payer: BLUE CROSS/BLUE SHIELD

## 2016-01-18 ENCOUNTER — Encounter: Payer: Self-pay | Admitting: Emergency Medicine

## 2016-01-18 ENCOUNTER — Emergency Department
Admission: EM | Admit: 2016-01-18 | Discharge: 2016-01-18 | Disposition: A | Discharge status: Home / Self Care | Payer: BLUE CROSS/BLUE SHIELD | Attending: Emergency Medicine | Admitting: Emergency Medicine

## 2016-01-18 DIAGNOSIS — S92351A Displaced fracture of fifth metatarsal bone, right foot, initial encounter for closed fracture: Secondary | ICD-10-CM | POA: Insufficient documentation

## 2016-01-18 DIAGNOSIS — Y9341 Activity, dancing: Secondary | ICD-10-CM | POA: Diagnosis not present

## 2016-01-18 DIAGNOSIS — X501XXA Overexertion from prolonged static or awkward postures, initial encounter: Secondary | ICD-10-CM | POA: Diagnosis not present

## 2016-01-18 DIAGNOSIS — Y929 Unspecified place or not applicable: Secondary | ICD-10-CM | POA: Diagnosis not present

## 2016-01-18 DIAGNOSIS — Y998 Other external cause status: Secondary | ICD-10-CM | POA: Diagnosis not present

## 2016-01-18 DIAGNOSIS — M25571 Pain in right ankle and joints of right foot: Secondary | ICD-10-CM | POA: Diagnosis present

## 2016-01-18 DIAGNOSIS — S92301A Fracture of unspecified metatarsal bone(s), right foot, initial encounter for closed fracture: Secondary | ICD-10-CM

## 2016-01-18 MED ORDER — TRAMADOL HCL 50 MG PO TABS: 50.0000 mg | ORAL_TABLET | Freq: Four times a day (QID) | ORAL | Status: AC | PRN

## 2016-01-18 NOTE — ED Notes (Addendum)
Pt to triage via w/c with no distress noted, brought in by EMS; pt reports injuring right ankle during a dance audition PTA; ice pack applied

## 2016-01-18 NOTE — ED Provider Notes (Signed)
[facility] Emergency Department Provider Note  ____________________________________________  Time seen: Approximately 10:04 PM  I have reviewed the triage vital signs and the nursing notes.   HISTORY  Chief Complaint Ankle Pain    HPI [name] is a 20 y.o. female who presents emergency department complaining of right foot pain. Patient states that she was at dance this evening when she did alleviate and landing awkwardly on her right foot. She is complaining of pain to the right lateral mid foot. She denies any swelling or ecchymosis. Patient did not fall and hit her head or lose consciousness at any point. Patient reports the pain is sharp, worse with movement or weightbearing. And is moderate to severe.   History reviewed. No pertinent past medical history.  Patient Active Problem List   Diagnosis Date Noted  . Epigastric abdominal pain 09/20/2015  . Obstructive jaundice 09/20/2015  . Gallstones 09/20/2015  . Hyperglycemia 09/20/2015    Past Surgical History  Procedure Laterality Date  . Cholecystectomy      Current Outpatient Rx  Name  Route  Sig  Dispense  Refill  . norethindrone-ethinyl estradiol (JUNEL FE,GILDESS FE,LOESTRIN FE) 1-20 MG-MCG tablet   Oral   Take 1 tablet by mouth daily.         . traMADol (ULTRAM) 50 MG tablet   Oral   Take 1 tablet (50 mg total) by mouth every 6 (six) hours as needed.   20 tablet   0     Allergies Clindamycin/lincomycin  No family history on file.  Social History Social History  Substance Use Topics  . Smoking status: Never Smoker   . Smokeless tobacco: None  . Alcohol Use: Yes     Review of Systems  Constitutional: No fever/chills Cardiovascular: no chest pain. Respiratory: no cough. No SOB. Musculoskeletal: Positive for right foot pain. Skin: Negative for rash, abrasions, lacerations, ecchymosis. Neurological: Negative for headaches, focal weakness or numbness. 10-point ROS  otherwise negative.  ____________________________________________   PHYSICAL EXAM:  VITAL SIGNS: ED Triage Vitals  Enc Vitals Group     BP 01/18/16 2039 127/79 mmHg     Pulse Rate 01/18/16 2039 86     Resp 01/18/16 2039 20     Temp 01/18/16 2039 97.6 F (36.4 C)     Temp Source 01/18/16 2039 Oral     SpO2 01/18/16 2039 99 %     Weight 01/18/16 2039 220 lb (99.791 kg)     Height 01/18/16 2039 5\' 7"  (1.702 m)     Head Cir --      Peak Flow --      Pain Score 01/18/16 2038 7     Pain Loc --      Pain Edu? --      Excl. in GC? --      Constitutional: Alert and oriented. Well appearing and in no acute distress. Eyes: Conjunctivae are normal. PERRL. EOMI. Head: Atraumatic. Cardiovascular: Normal rate, regular rhythm. Normal S1 and S2.  Good peripheral circulation. Respiratory: Normal respiratory effort without tachypnea or retractions. Lungs CTAB. Good air entry to the bases with no decreased or absent breath sounds. Musculoskeletal: Full range of motion to all extremities. No gross deformities appreciated.No deformities noted to the right ankle or foot. Patient has limited range of motion to her ankle joint due to pain. Patient is very tender to palpation over the base of the fifth metatarsal. No palpable abnormality. Full range of motion 5 digits of the foot. Sensation intact  5 digits. Cap refill intact 5 digits. Dorsalis pedis pulses appreciated. Neurologic:  Normal speech and language. No gross focal neurologic deficits are appreciated.  Skin:  Skin is warm, dry and intact. No rash noted. Psychiatric: Mood and affect are normal. Speech and behavior are normal. Patient exhibits appropriate insight and judgement.   ____________________________________________   LABS (all labs ordered are listed, but only abnormal results are displayed)  Labs Reviewed - No data to  display ____________________________________________  EKG   ____________________________________________  RADIOLOGY Festus Barren Cuthriell, personally viewed and evaluated these images (plain radiographs) as part of my medical decision making, as well as reviewing the written report by the radiologist.  Dg Ankle Complete Right  01/18/2016  CLINICAL DATA:  Dance injury. EXAM: RIGHT ANKLE - COMPLETE 3+ VIEW COMPARISON:  None. FINDINGS: There is a fracture at the base of the fifth metatarsal, likely acute. Ankle appears intact. Mortise is symmetric. IMPRESSION: Fifth metatarsal base fracture. Electronically Signed   By: Ellery Plunk M.D.   On: 01/18/2016 20:59    ____________________________________________    PROCEDURES  Procedure(s) performed:   SPLINT APPLICATION Date/Time: 10:24 PM Authorized by: Racheal Patches Consent: Verbal consent obtained. Risks and benefits: risks, benefits and alternatives were discussed Consent given by: patient Splint applied by: ED tech  Location details: Right ankle  Splint type: Posterior OCL  Supplies used: Ortho-Glass  Post-procedure: The splinted body part was neurovascularly unchanged following the procedure. Patient tolerance: Patient tolerated the procedure well with no immediate complications.       Medications - No data to display   ____________________________________________   INITIAL IMPRESSION / ASSESSMENT AND PLAN / ED COURSE  Pertinent labs & imaging results that were available during my care of the patient were reviewed by me and considered in my medical decision making (see chart for details).  Patient's diagnosis is consistent with fifth metatarsal fracture to the right foot. Patient is splinted in emergency department and given crutches for ambulation.. Patient will be discharged home with prescriptions for limited prescription for pain medication. Patient is to follow up with orthopedics. Patient is given  ED precautions to return to the ED for any worsening or new symptoms.     ____________________________________________  FINAL CLINICAL IMPRESSION(S) / ED DIAGNOSES  Final diagnoses:  Fracture of 5th metatarsal, right, closed, initial encounter      NEW MEDICATIONS STARTED DURING THIS VISIT:  New Prescriptions   TRAMADOL (ULTRAM) 50 MG TABLET    Take 1 tablet (50 mg total) by mouth every 6 (six) hours as needed.        This chart was dictated using voice recognition software/Dragon. Despite best efforts to proofread, errors can occur which can change the meaning. Any change was purely unintentional.    Racheal Patches, PA-C 01/18/16 2224  Sharman Cheek, MD 01/19/16 [id number]

## 2016-01-18 NOTE — Discharge Instructions (Signed)
Cast or Splint Care °Casts and splints support injured limbs and keep bones from moving while they heal. It is important to care for your cast or splint at home.   °HOME CARE INSTRUCTIONS °· Keep the cast or splint uncovered during the drying period. It can take 24 to 48 hours to dry if it is made of plaster. A fiberglass cast will dry in less than 1 hour. °· Do not rest the cast on anything harder than a pillow for the first 24 hours. °· Do not put weight on your injured limb or apply pressure to the cast until your health care provider gives you permission. °· Keep the cast or splint dry. Wet casts or splints can lose their shape and may not support the limb as well. A wet cast that has lost its shape can also create harmful pressure on your skin when it dries. Also, wet skin can become infected. °· Cover the cast or splint with a plastic bag when bathing or when out in the rain or snow. If the cast is on the trunk of the body, take sponge baths until the cast is removed. °· If your cast does become wet, dry it with a towel or a blow dryer on the cool setting only. °· Keep your cast or splint clean. Soiled casts may be wiped with a moistened cloth. °· Do not place any hard or soft foreign objects under your cast or splint, such as cotton, toilet paper, lotion, or powder. °· Do not try to scratch the skin under the cast with any object. The object could get stuck inside the cast. Also, scratching could lead to an infection. If itching is a problem, use a blow dryer on a cool setting to relieve discomfort. °· Do not trim or cut your cast or remove padding from inside of it. °· Exercise all joints next to the injury that are not immobilized by the cast or splint. For example, if you have a long leg cast, exercise the hip joint and toes. If you have an arm cast or splint, exercise the shoulder, elbow, thumb, and fingers. °· Elevate your injured arm or leg on 1 or 2 pillows for the first 1 to 3 days to decrease  swelling and pain. It is best if you can comfortably elevate your cast so it is higher than your heart. °SEEK MEDICAL CARE IF:  °· Your cast or splint cracks. °· Your cast or splint is too tight or too loose. °· You have unbearable itching inside the cast. °· Your cast becomes wet or develops a soft spot or area. °· You have a bad smell coming from inside your cast. °· You get an object stuck under your cast. °· Your skin around the cast becomes red or raw. °· You have new pain or worsening pain after the cast has been applied. °SEEK IMMEDIATE MEDICAL CARE IF:  °· You have fluid leaking through the cast. °· You are unable to move your fingers or toes. °· You have discolored (blue or white), cool, painful, or very swollen fingers or toes beyond the cast. °· You have tingling or numbness around the injured area. °· You have severe pain or pressure under the cast. °· You have any difficulty with your breathing or have shortness of breath. °· You have chest pain. °  °This information is not intended to replace advice given to you by your health care provider. Make sure you discuss any questions you have with your health care   provider. °  °Document Released: 08/31/2000 Document Revised: 06/24/2013 Document Reviewed: 03/12/2013 °Elsevier Interactive Patient Education ©2016 [employer] ° °Metatarsal Fracture °A metatarsal fracture is a break in a metatarsal bone. Metatarsal bones connect your toe bones to your ankle bones. °CAUSES °This type of fracture may be caused by: °· A sudden twisting of your foot. °· A fall onto your foot. °· Overuse or repetitive exercise. °RISK FACTORS °This condition is more likely to develop in people who: °· Play contact sports. °· Have a bone disease. °· Have a low calcium level. °SYMPTOMS °Symptoms of this condition include: °· Pain that is worse when walking or standing. °· Pain when pressing on the foot or moving the toes. °· Swelling. °· Bruising on the top or bottom of the foot. °· A  foot that appears shorter than the other one. °DIAGNOSIS °This condition is diagnosed with a physical exam. You may also have imaging tests, such as: °· X-rays. °· A CT scan. °· MRI. °TREATMENT °Treatment for this condition depends on its severity and whether a bone has moved out of place. Treatment may involve: °· Rest. °· Wearing foot support such as a cast, splint, or boot for several weeks. °· Using crutches. °· Surgery to move bones back into the right position. Surgery is usually needed if there are many pieces of broken bone or bones that are very out of place (displaced fracture). °· Physical therapy. This may be needed to help you regain full movement and strength in your foot. °You will need to return to your health care provider to have X-rays taken until your bones heal. Your health care provider will look at the X-rays to make sure that your foot is healing well. °HOME CARE INSTRUCTIONS  °If You Have a Cast: °· Do not stick anything inside the cast to scratch your skin. Doing that increases your risk of infection. °· Check the skin around the cast every day. Report any concerns to your health care provider. You may put lotion on dry skin around the edges of the cast. Do not apply lotion to the skin underneath the cast. °· Keep the cast clean and dry. °If You Have a Splint or a Supportive Boot: °· Wear it as directed by your health care provider. Remove it only as directed by your health care provider. °· Loosen it if your toes become numb and tingle, or if they turn cold and blue. °· Keep it clean and dry. °Bathing °· Do not take baths, swim, or use a hot tub until your health care provider approves. Ask your health care provider if you can take showers. You may only be allowed to take sponge baths for bathing. °· If your health care provider approves bathing and showering, cover the cast or splint with a watertight plastic bag to protect it from water. Do not let the cast or splint get wet. °Managing  Pain, Stiffness, and Swelling °· If directed, apply ice to the injured area (if you have a splint, not a cast). °¨ Put ice in a plastic bag. °¨ Place a towel between your skin and the bag. °¨ Leave the ice on for 20 minutes, 2-3 times per day. °· Move your toes often to avoid stiffness and to lessen swelling. °· Raise (elevate) the injured area above the level of your heart while you are sitting or lying down. °Driving °· Do not drive or operate heavy machinery while taking pain medicine. °· Do not drive while wearing foot support   on a foot that you use for driving. °Activity °· Return to your normal activities as directed by your health care provider. Ask your health care provider what activities are safe for you. °· Perform exercises as directed by your health care provider or physical therapist. °Safety °· Do not use the injured foot to support your body weight until your health care provider says that you can. Use crutches as directed by your health care provider. °General Instructions °· Do not put pressure on any part of the cast or splint until it is fully hardened. This may take several hours. °· Do not use any tobacco products, including cigarettes, chewing tobacco, or e-cigarettes. Tobacco can delay bone healing. If you need help quitting, ask your health care provider. °· Take medicines only as directed by your health care provider. °· Keep all follow-up visits as directed by your health care provider. This is important. °SEEK MEDICAL CARE IF: °· You have a fever. °· Your cast, splint, or boot is too loose or too tight. °· Your cast, splint, or boot is damaged. °· Your pain medicine is not helping. °· You have pain, tingling, or numbness in your foot that is not going away. °SEEK IMMEDIATE MEDICAL CARE IF: °· You have severe pain. °· You have tingling or numbness in your foot that is getting worse. °· Your foot feels cold or becomes numb. °· Your foot changes color. °  °This information is not intended to  replace advice given to you by your health care provider. Make sure you discuss any questions you have with your health care provider. °  °Document Released: 05/26/2002 Document Revised: 01/18/2015 Document Reviewed: 06/30/2014 °Elsevier Interactive Patient Education ©2016 [employer] ° °

## 2016-01-24 IMAGING — US US ABDOMEN LIMITED
1 series · 14 of 25 positions shown · non-contrast
Comparison: None.

CLINICAL DATA: Epigastric pain for 1 day

EXAM:
US ABDOMEN LIMITED - RIGHT UPPER QUADRANT

[Series 1: us abdomen limited · 0.23mm/px · 14 of 48 slices shown]
[im 1/48]
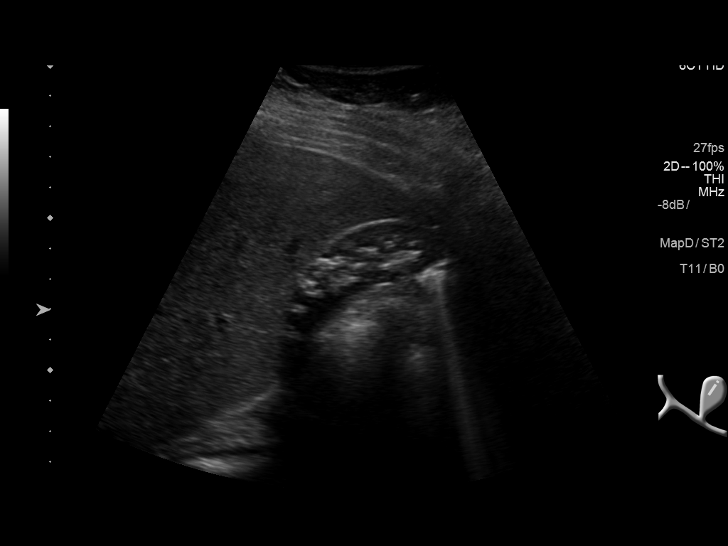
[im 4/48]
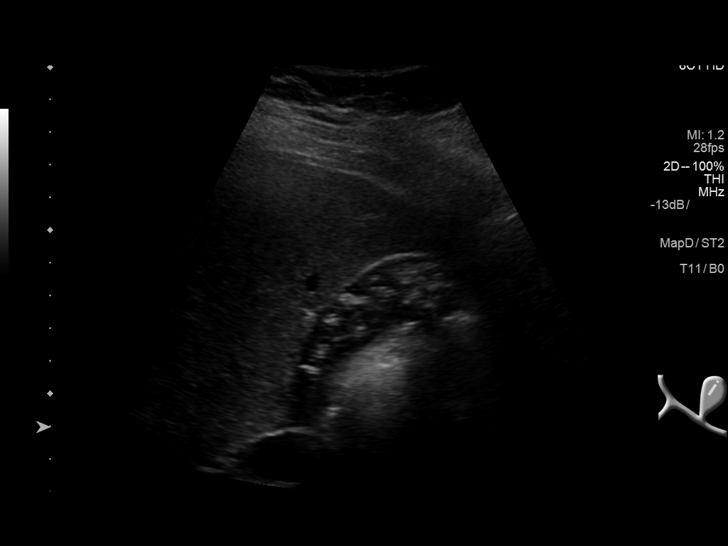
[im 8/48]
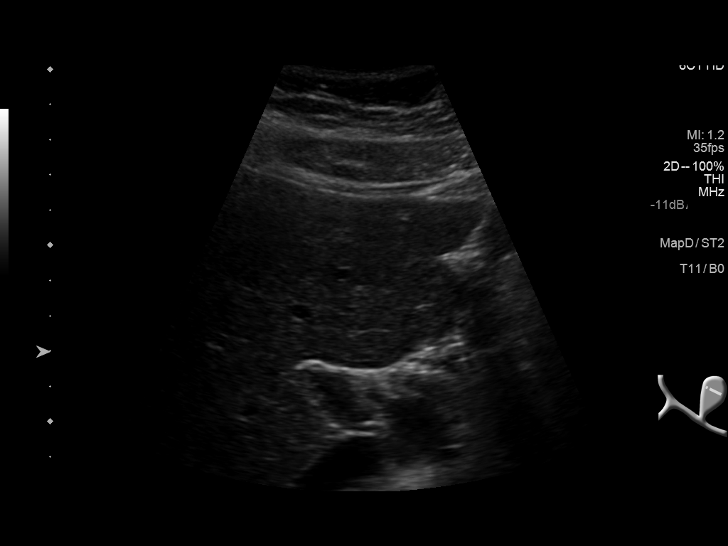
[im 12/48]
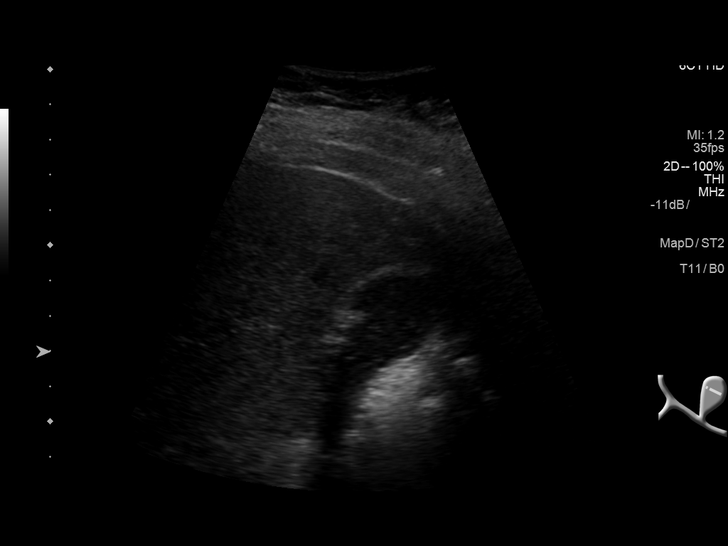
[im 16/48]
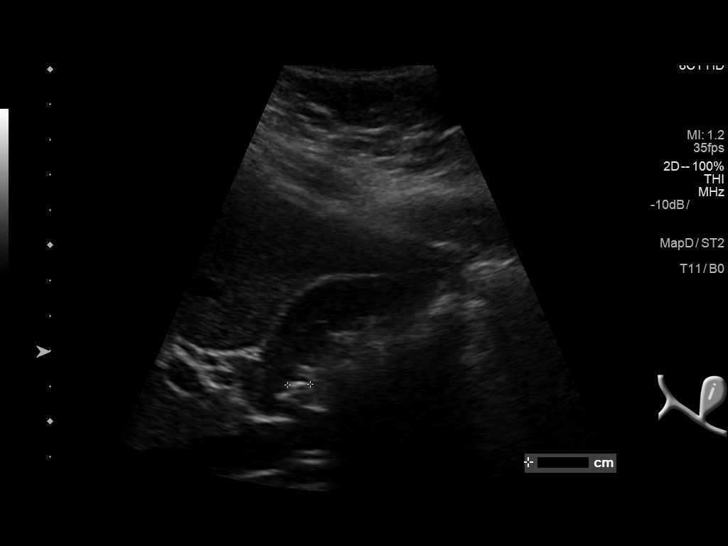
[im 18/48]
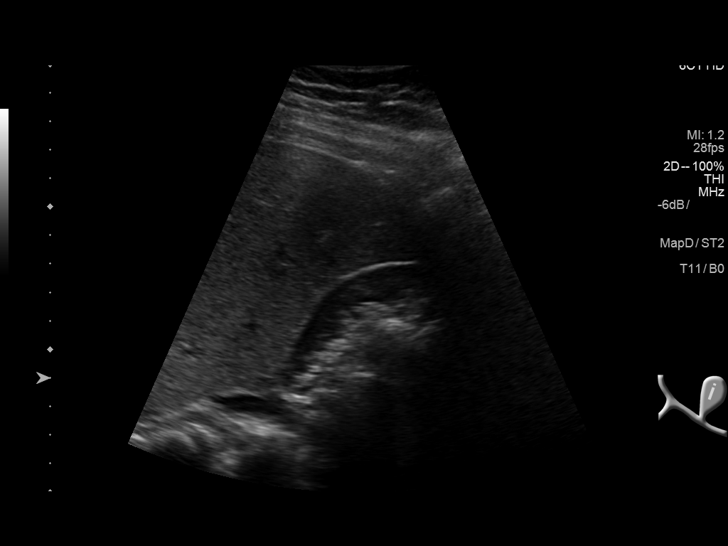
[im 22/48]
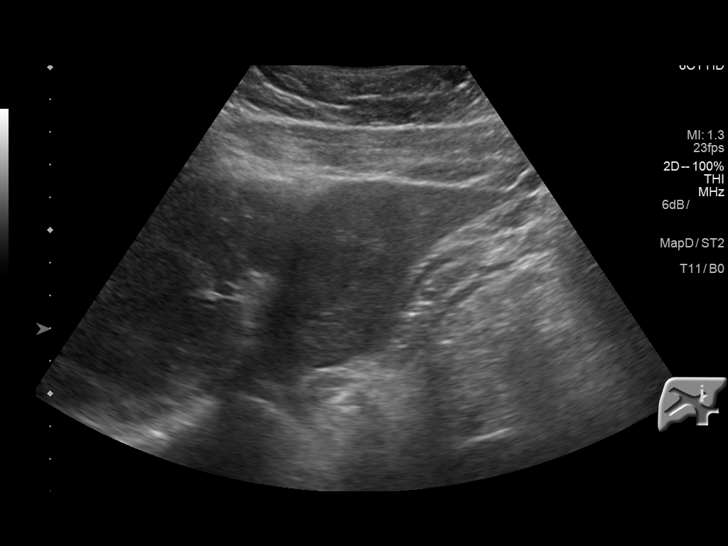
[im 26/48]
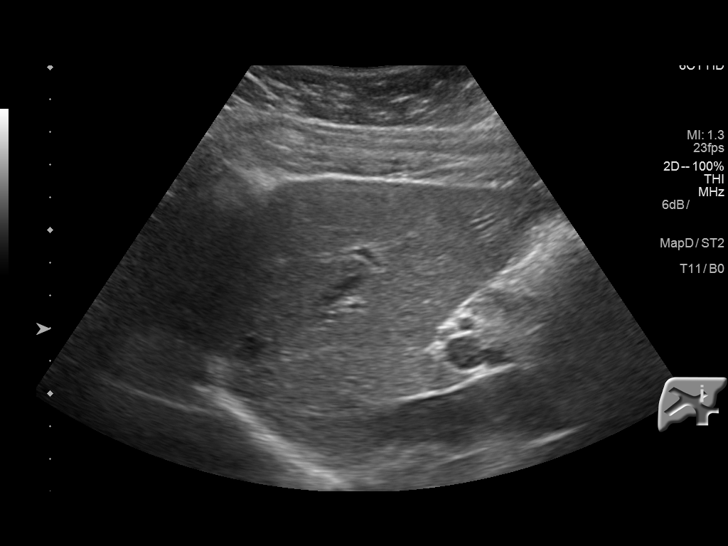
[im 30/48]
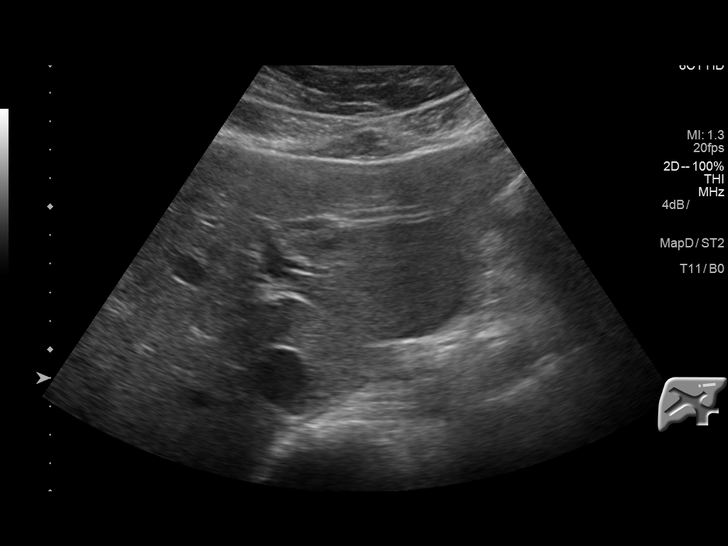
[im 32/48]
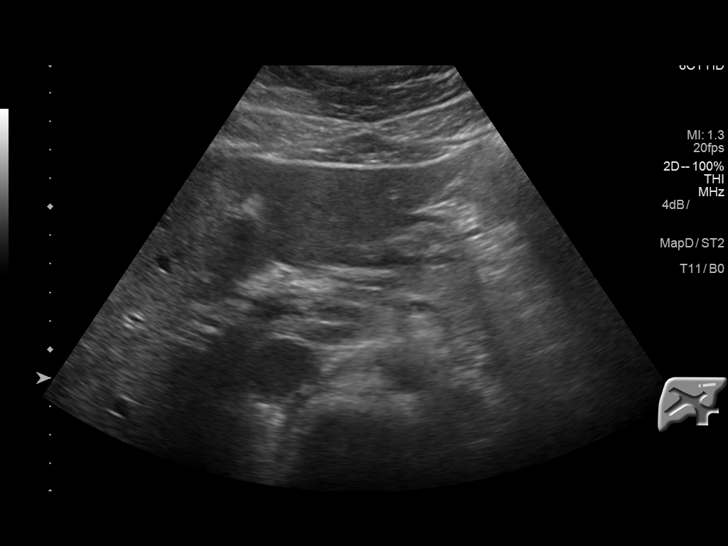
[im 36/48]
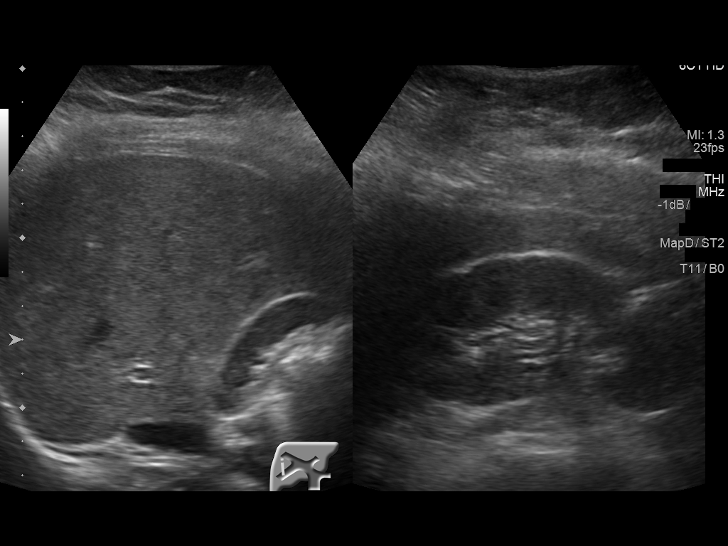
[im 40/48]
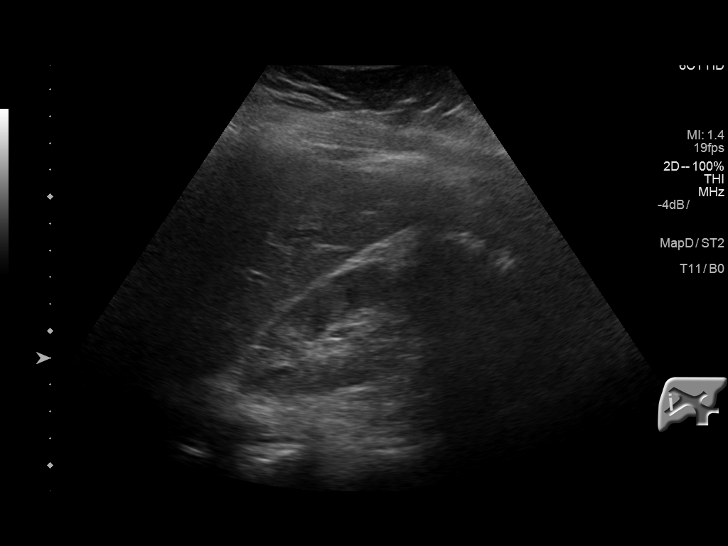
[im 44/48]
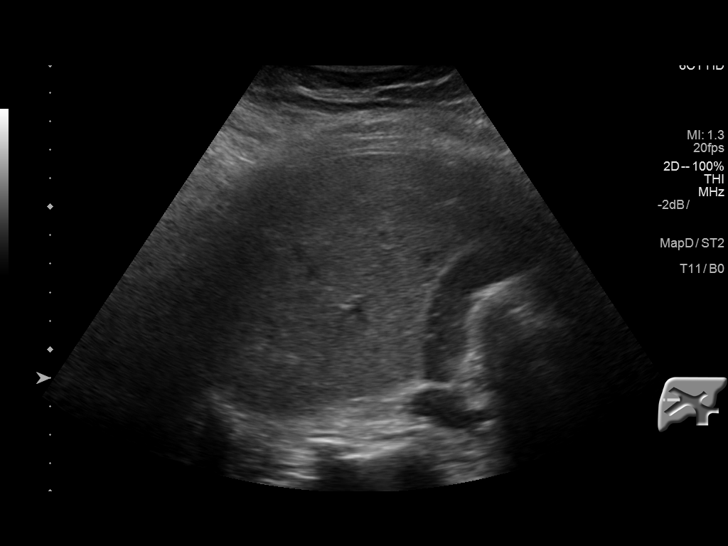
[im 48/48]
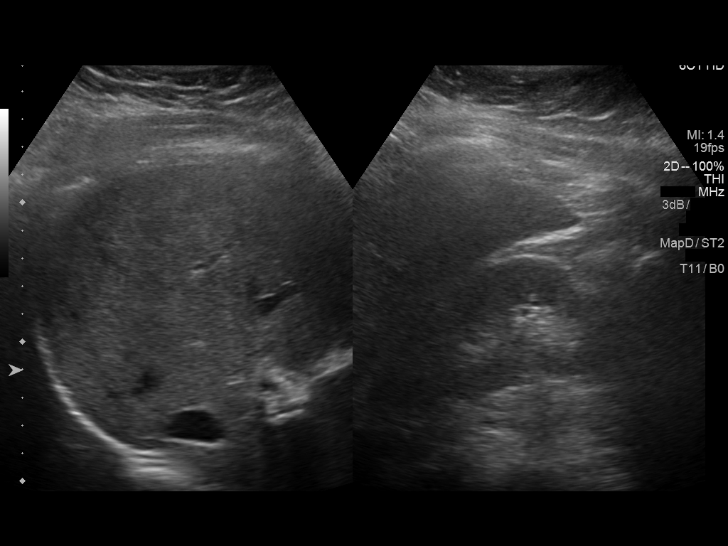

[14 of 25 positions shown; findings below may reference images not displayed]

FINDINGS: Gallbladder:

Multiple mobile gallstones are noted within gallbladder the largest
measures 7 mm. No thickening of gallbladder wall. No sonographic
Murphy's sign.

Common bile duct:

Diameter: 3 mm in diameter within normal limits.

Liver:

No focal lesion identified. Within normal limits in parenchymal
echogenicity.
IMPRESSION: 1. Multiple mobile gallstones are noted within gallbladder the
largest measures 7 mm. No thickening of gallbladder wall. No
sonographic Murphy's sign. Normal CBD.

## 2016-02-10 IMAGING — MR MR ABDOMEN WO/W CM MRCP
11 of 18 series · 27 of 48 positions shown · IV contrast (multihance)
Comparison: Ultrasound 09/20/2015

CLINICAL DATA: Elevated bilirubin. Multiple gallstones. Concern for
common bile duct stone.

EXAM:
MRI ABDOMEN WITHOUT AND WITH CONTRAST (INCLUDING MRCP)
TECHNIQUE: Multiplanar multisequence MR imaging of the abdomen was performed
both before and after the administration of intravenous contrast.
Heavily T2-weighted images of the biliary and pancreatic ducts were
obtained, and three-dimensional MRCP images were rendered by post
processing.
CONTRAST:  19mL MULTIHANCE GADOBENATE DIMEGLUMINE 529 MG/ML IV SOLN

[Series 2: T2 fat-sat · axial · 7.0mm · 0.74mm/px · 1 of 27 slices shown]
[im 1/27]
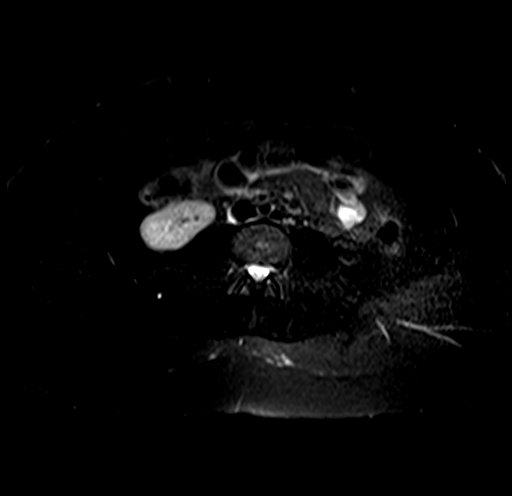

[Series 3: cor tru fisp · coronal · 4.0mm · 0.74mm/px · 1 of 50 slices shown]
[im 1/50]
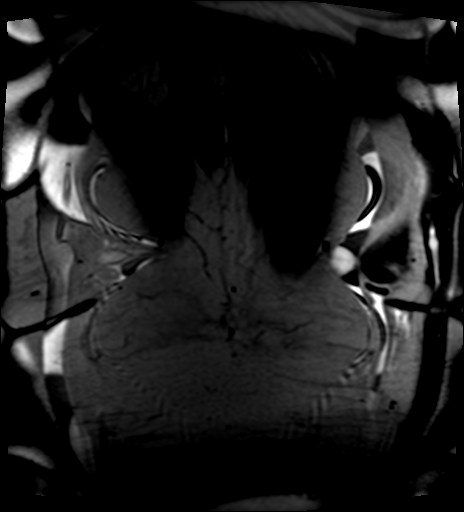

[Series 10: DWI · axial · 6.0mm · 1.98mm/px · z∈[-169,+111]mm · 3 of 120 slices shown]
[im 1/120]
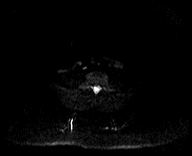
[im 60/120]
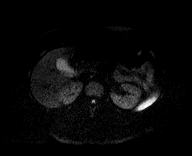
[im 120/120]
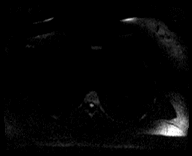

[Series 11: ax dwi_adc · axial · 6.0mm · 1.98mm/px · 1 of 40 slices shown]
[im 1/40]
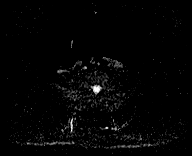

[Series 16: T2 · axial · 7.0mm · 0.74mm/px · 1 of 27 slices shown]
[im 1/27]
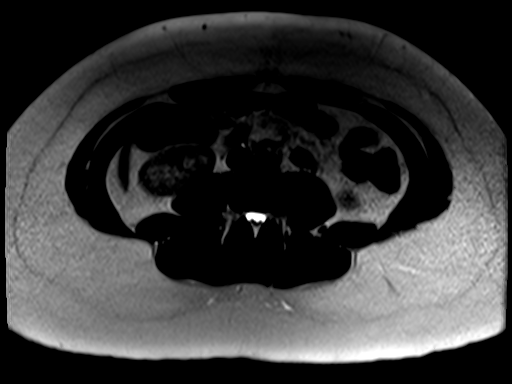

[Series 17: T1 dynamic fat-sat · axial · non-contrast · 2.5mm · 0.74mm/px · z∈[-143,+95]mm · 3 of 96 slices shown (1 of 2)]
[im 1/96]
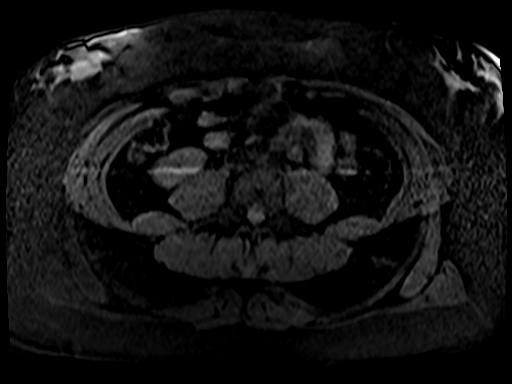
[im 48/96]
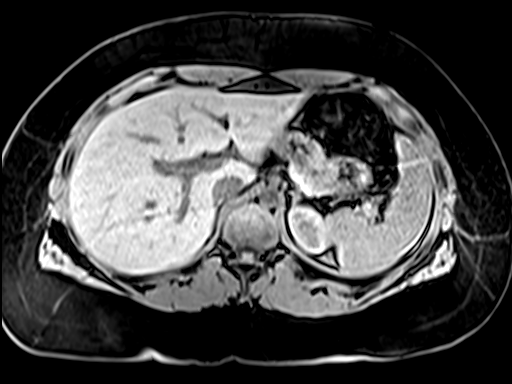
[im 96/96]
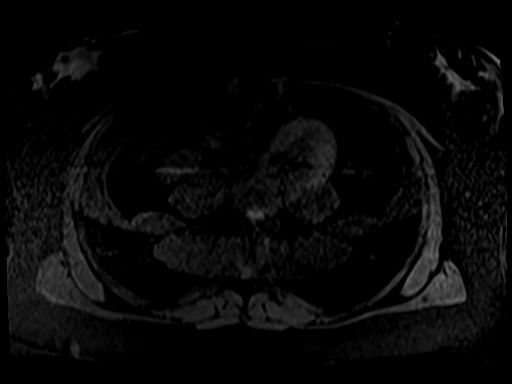

[Series 18: T1 dynamic fat-sat · axial · 2.5mm · 0.74mm/px · z∈[-143,+95]mm · 4 of 96 slices shown (2 of 2)]
[im 1/96]
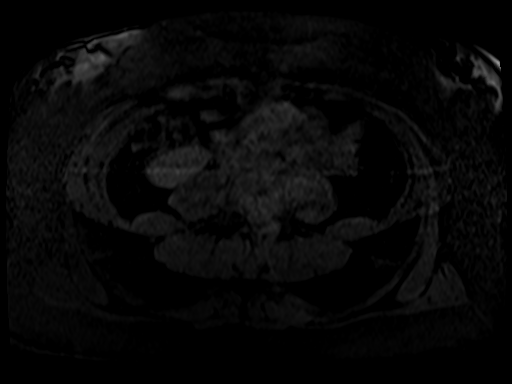
[im 32/96]
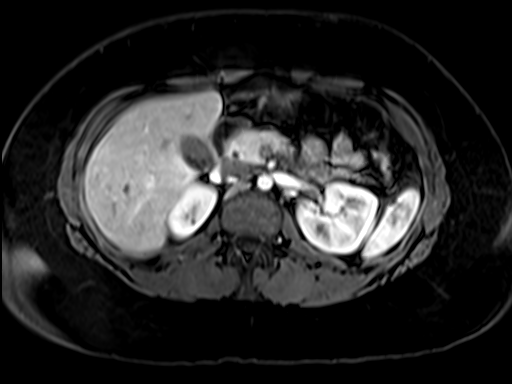
[im 64/96]
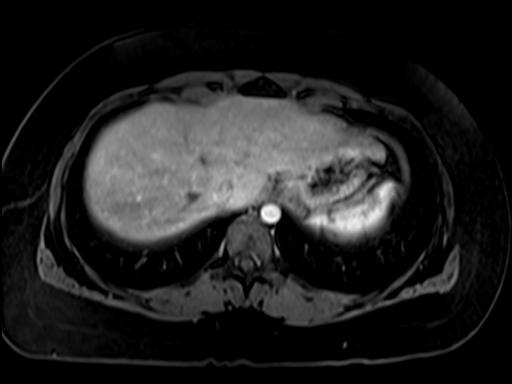
[im 96/96]
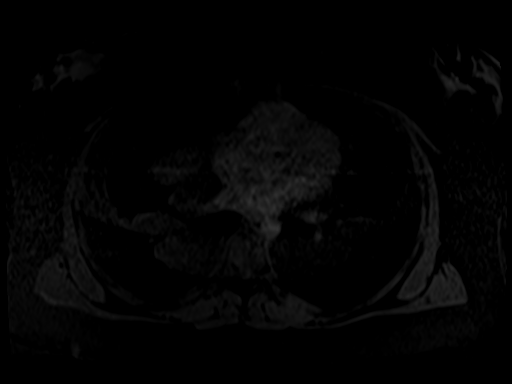

[Series 19: T1 dynamic fat-sat post-contrast · axial · 2.5mm · 0.74mm/px · z∈[-143,+95]mm · 4 of 96 slices shown (1 of 4)]
[im 1/96]
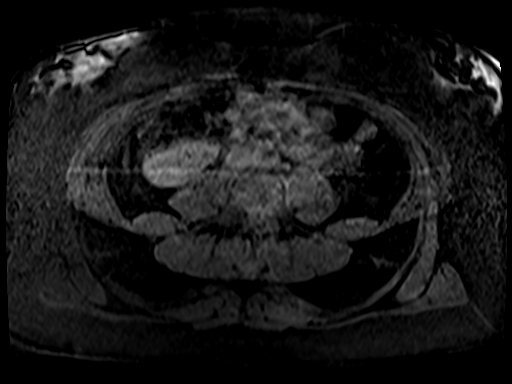
[im 32/96]
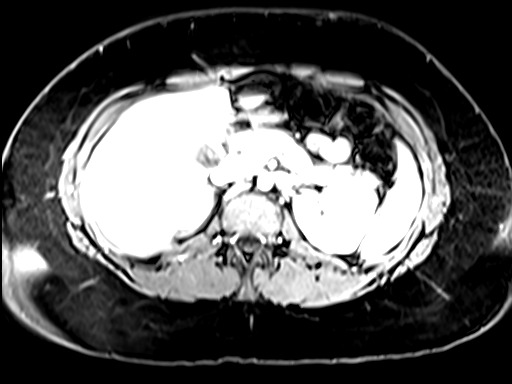
[im 64/96]
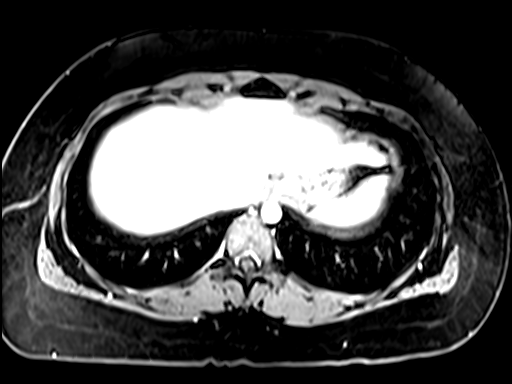
[im 96/96]
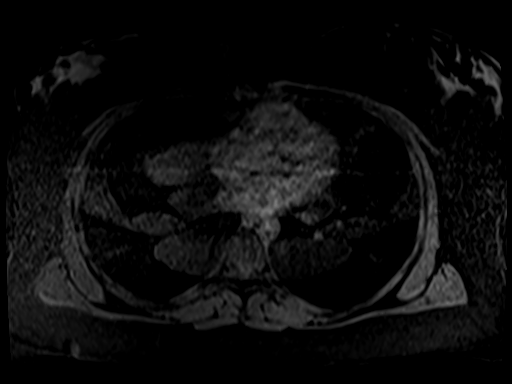

[Series 20: T1 dynamic fat-sat post-contrast · axial · 2.5mm · 0.74mm/px · z∈[-143,+95]mm · 4 of 96 slices shown (2 of 4)]
[im 1/96]
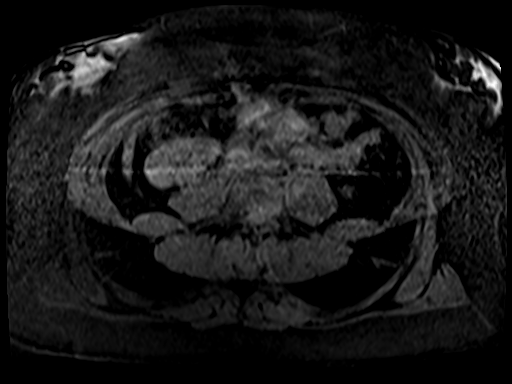
[im 32/96]
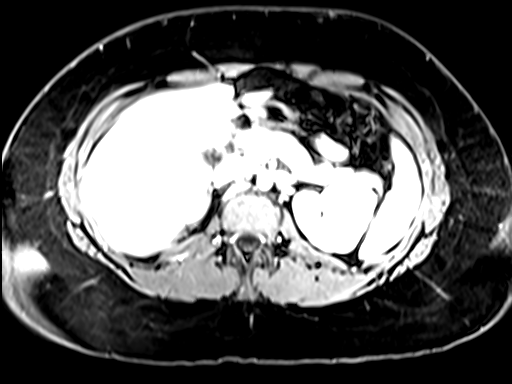
[im 64/96]
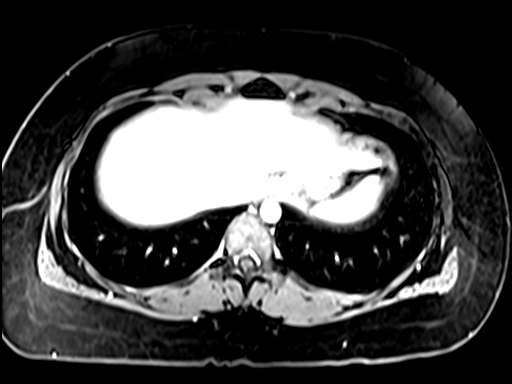
[im 96/96]
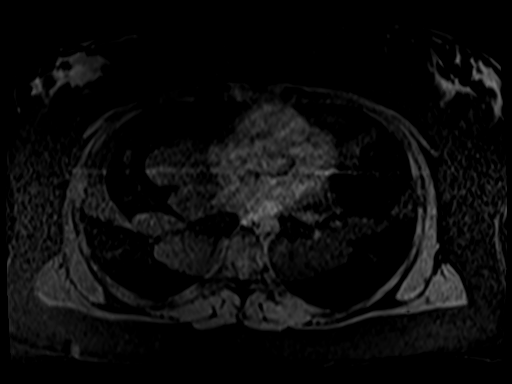

[Series 21: T1 dynamic fat-sat post-contrast · coronal · 2.5mm · 0.82mm/px · 3 of 88 slices shown (3 of 4)]
[im 1/88]
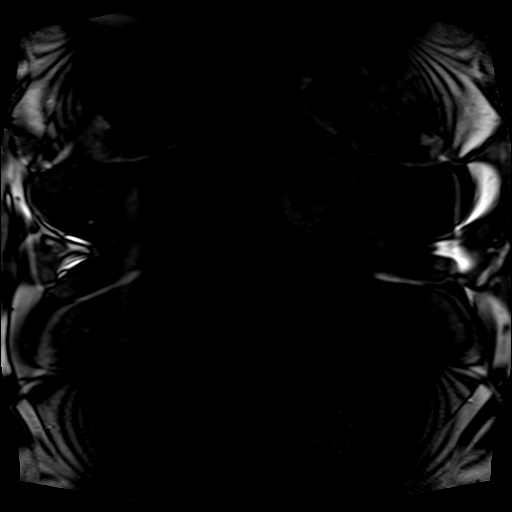
[im 44/88]
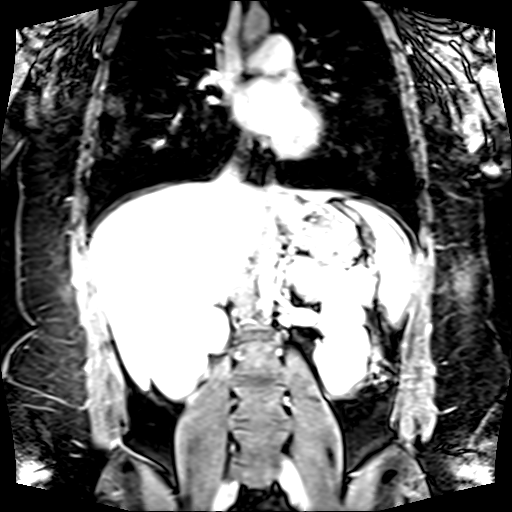
[im 88/88]
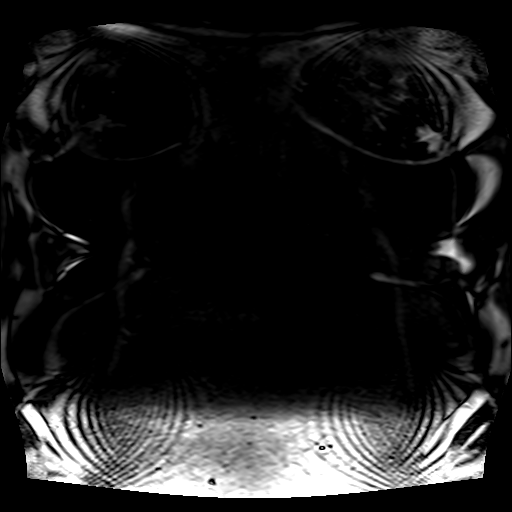

[Series 22: T1 dynamic fat-sat post-contrast · axial · 2.5mm · 0.74mm/px · z∈[-143,-65]mm · 2 of 96 slices shown (4 of 4)]
[im 1/96]
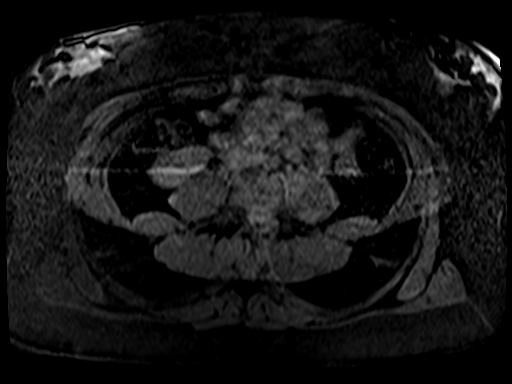
[im 32/96]
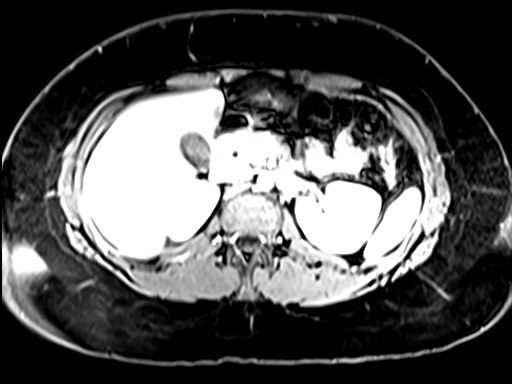

[27 of 48 positions shown; findings below may reference images not displayed]

FINDINGS: Lower chest:  Lung bases are clear.

Hepatobiliary: Liver has normal signal intensity. No hepatic
steatosis. No intrahepatic biliary duct dilatation.

The common hepatic duct and common bile duct are normal caliber. The
common hepatic duct measures 4 mm while the common bile duct
measures 3 mm. There is no filling defect within the common bile
duct. No external compression.

Multiple small gallstones layer dependently within the gallbladder
lumen measuring approximately 3 mm each. There is no gallbladder
wall thickening or inflammation evident.

The post-contrast imaging demonstrates no enhancing hepatic lesion.

Pancreas: Normal pancreatic parenchymal intensity. No ductal
dilatation or inflammation.

Spleen: Normal spleen.

Adrenals/urinary tract: Adrenal glands and kidneys are normal.

Stomach/Bowel: Submental and limited view of the bowel is
unremarkable.

Vascular/Lymphatic: No abdominal lymphadenopathy.

Musculoskeletal: No aggressive osseous lesion.
IMPRESSION: 1. No evidence of biliary obstruction.  No choledocholithiasis.
2. Multiple gallstones within the lumen of the gallbladder without
evidence cholecystitis.
3. No focal hepatic abnormality.
These results will be called to the ordering clinician or
representative by the Radiologist Assistant, and communication
documented in the PACS or zVision Dashboard.
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
